# Patient Record
Sex: Female | Born: 1995 | Race: Black or African American | Hispanic: No | Marital: Single | Smoking: Former smoker
Health system: Southern US, Community
[De-identification: ages and names within clinical notes are randomized; demographics above are authoritative.]

## PROBLEM LIST (undated history)

## (undated) ENCOUNTER — Inpatient Hospital Stay (HOSPITAL_COMMUNITY): Payer: Self-pay

## (undated) DIAGNOSIS — G43909 Migraine, unspecified, not intractable, without status migrainosus: Secondary | ICD-10-CM

## (undated) DIAGNOSIS — A749 Chlamydial infection, unspecified: Secondary | ICD-10-CM

## (undated) DIAGNOSIS — D649 Anemia, unspecified: Secondary | ICD-10-CM

## (undated) HISTORY — PX: WISDOM TOOTH EXTRACTION: SHX21

---
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
## 2015-02-15 ENCOUNTER — Inpatient Hospital Stay (HOSPITAL_COMMUNITY)
Admission: AD | Admit: 2015-02-15 | Discharge: 2015-02-15 | Disposition: A | Discharge status: Home / Self Care | Payer: Medicaid Other | Source: Ambulatory Visit | Attending: Obstetrics and Gynecology | Admitting: Obstetrics and Gynecology

## 2015-02-15 ENCOUNTER — Inpatient Hospital Stay (HOSPITAL_COMMUNITY): Payer: Medicaid Other

## 2015-02-15 ENCOUNTER — Encounter (HOSPITAL_COMMUNITY): Payer: Self-pay | Admitting: *Deleted

## 2015-02-15 DIAGNOSIS — R1030 Lower abdominal pain, unspecified: Secondary | ICD-10-CM | POA: Insufficient documentation

## 2015-02-15 DIAGNOSIS — R102 Pelvic and perineal pain: Secondary | ICD-10-CM | POA: Diagnosis not present

## 2015-02-15 DIAGNOSIS — Z87891 Personal history of nicotine dependence: Secondary | ICD-10-CM | POA: Insufficient documentation

## 2015-02-15 LAB — URINALYSIS, ROUTINE W REFLEX MICROSCOPIC
Bilirubin Urine: NEGATIVE
Glucose, UA: NEGATIVE mg/dL
Ketones, ur: 15 mg/dL — AB
Nitrite: NEGATIVE
PROTEIN: NEGATIVE mg/dL
Specific Gravity, Urine: 1.025 (ref 1.005–1.030)
Urobilinogen, UA: 1 mg/dL (ref 0.0–1.0)
pH: 6 (ref 5.0–8.0)

## 2015-02-15 LAB — WET PREP, GENITAL
Trich, Wet Prep: NONE SEEN
Yeast Wet Prep HPF POC: NONE SEEN

## 2015-02-15 LAB — CBC
HCT: 32.6 % — ABNORMAL LOW (ref 36.0–46.0)
Hemoglobin: 11.1 g/dL — ABNORMAL LOW (ref 12.0–15.0)
MCH: 30.7 pg (ref 26.0–34.0)
MCHC: 34 g/dL (ref 30.0–36.0)
MCV: 90.1 fL (ref 78.0–100.0)
PLATELETS: 164 10*3/uL (ref 150–400)
RBC: 3.62 MIL/uL — ABNORMAL LOW (ref 3.87–5.11)
RDW: 13.5 % (ref 11.5–15.5)
WBC: 14.3 10*3/uL — AB (ref 4.0–10.5)

## 2015-02-15 LAB — URINE MICROSCOPIC-ADD ON

## 2015-02-15 LAB — POCT PREGNANCY, URINE: Preg Test, Ur: NEGATIVE

## 2015-02-15 MED ORDER — KETOROLAC TROMETHAMINE 60 MG/2ML IM SOLN
60.0000 mg | Freq: Once | INTRAMUSCULAR | Status: AC
  Administered 2015-02-15: 60 mg via INTRAMUSCULAR
  Filled 2015-02-15: qty 2

## 2015-02-15 MED ORDER — KETOROLAC TROMETHAMINE 10 MG PO TABS: 10.0000 mg | ORAL_TABLET | Freq: Four times a day (QID) | ORAL | Status: DC | PRN

## 2015-02-15 NOTE — Discharge Instructions (Signed)

## 2015-02-15 NOTE — MAU Note (Signed)
Lower abdominal pain since last night.  LMP 02/10/15.  Denies vaginal discharge. Light bleeding from the end of her period.  Some nausea, denies vomiting/diarrhea.  Reports cold sweat but has not checked temp.

## 2015-02-15 NOTE — MAU Note (Signed)
Pt states that drinking alcohol may have brought this on. Last had alcohol on Sat. and was intoxicated. The abdominal pain woke patient out of sleep on Sunday.

## 2015-02-15 NOTE — MAU Provider Note (Signed)
History     CSN: [id number]  Arrival date and time: 02/15/15 7829   First Provider Initiated Contact with Patient 02/15/15 2202      Chief Complaint  Patient presents with  . Abdominal Pain   Abdominal Pain This is a new problem. The current episode started yesterday. The onset quality is sudden. The problem occurs constantly. The problem has been gradually worsening. The pain is located in the suprapubic region. The pain is at a severity of 10/10. The quality of the pain is sharp and cramping. The abdominal pain radiates to the back. Associated symptoms include nausea. Pertinent negatives include no constipation, diarrhea, dysuria, fever, frequency or vomiting. The pain is aggravated by eating and movement. The pain is relieved by nothing. Treatments tried: NSAID  The treatment provided mild relief.     History reviewed. No pertinent past medical history.  History reviewed. No pertinent past surgical history.  History reviewed. No pertinent family history.  History  Substance Use Topics  . Smoking status: Former Smoker    Types: Cigars  . Smokeless tobacco: Not on file  . Alcohol Use: Yes    Allergies: No Known Allergies  Prescriptions prior to admission  Medication Sig Dispense Refill Last Dose  . ibuprofen (ADVIL,MOTRIN) 800 MG tablet Take 1 tablet (800 mg total) by mouth every 8 (eight) hours as needed for mild pain or moderate pain. 15 tablet 0 02/14/2015 at Unknown time  . cyclobenzaprine (FLEXERIL) 10 MG tablet Take 1 tablet (10 mg total) by mouth 3 (three) times daily as needed for muscle spasms. (Patient not taking: Reported on 02/15/2015) 10 tablet 0 more than one month  . HYDROcodone-acetaminophen (NORCO/VICODIN) 5-325 MG per tablet Take 1 tablet by mouth every 4 (four) hours as needed for moderate pain or severe pain. (Patient not taking: Reported on 02/15/2015) 15 tablet 0 more than one month  . valACYclovir (VALTREX) 1000 MG tablet Take 1 tablet (1,000 mg total) by  mouth 2 (two) times daily. (Patient not taking: Reported on 02/15/2015) 30 tablet 0     Review of Systems  Constitutional: Negative for fever.  Gastrointestinal: Positive for nausea and abdominal pain. Negative for vomiting, diarrhea and constipation.  Genitourinary: Negative for dysuria, urgency and frequency.   Physical Exam   Blood pressure 94/54, pulse 92, temperature 98.6 F (37 C), temperature source Oral, resp. rate 18, height  (1.6 m), weight 56.7 kg (125 lb), last menstrual period 02/10/2015.  Physical Exam  Nursing note and vitals reviewed. Constitutional: She is oriented to person, place, and time. She appears well-developed and well-nourished.  Cardiovascular: Normal rate.   Respiratory: Effort normal.  GI: Soft. There is no tenderness. There is no rebound.  Genitourinary:   External: no lesion Vagina: small amount of white discharge Cervix: pink, smooth, no CMT Uterus: NSSC Adnexa: R more tender than the L   Neurological: She is alert and oriented to person, place, and time.  Skin: Skin is warm and dry.  Psychiatric: She has a normal mood and affect.   Results for orders placed or performed during the hospital encounter of 02/15/15 (from the past 24 hour(s))  Urinalysis, Routine w reflex microscopic     Status: Abnormal   Collection Time: 02/15/15  8:06 PM  Result Value Ref Range   Color, Urine YELLOW YELLOW   APPearance CLOUDY (A) CLEAR   Specific Gravity, Urine 1.025 1.005 - 1.030   pH 6.0 5.0 - 8.0   Glucose, UA NEGATIVE NEGATIVE mg/dL  Hgb urine dipstick SMALL (A) NEGATIVE   Bilirubin Urine NEGATIVE NEGATIVE   Ketones, ur 15 (A) NEGATIVE mg/dL   Protein, ur NEGATIVE NEGATIVE mg/dL   Urobilinogen, UA 1.0 0.0 - 1.0 mg/dL   Nitrite NEGATIVE NEGATIVE   Leukocytes, UA LARGE (A) NEGATIVE  Urine microscopic-add on     Status: Abnormal   Collection Time: 02/15/15  8:06 PM  Result Value Ref Range   Squamous Epithelial / LPF FEW (A) RARE   WBC, UA TOO  NUMEROUS TO COUNT <3 WBC/hpf   RBC / HPF 0-2 <3 RBC/hpf   Bacteria, UA FEW (A) RARE   Urine-Other MUCOUS PRESENT   Pregnancy, urine POC     Status: None   Collection Time: 02/15/15  8:10 PM  Result Value Ref Range   Preg Test, Ur NEGATIVE NEGATIVE  CBC     Status: Abnormal   Collection Time: 02/15/15  9:52 PM  Result Value Ref Range   WBC 14.3 (H) 4.0 - 10.5 K/uL   RBC 3.62 (L) 3.87 - 5.11 MIL/uL   Hemoglobin 11.1 (L) 12.0 - 15.0 g/dL   HCT 16.1 (L) 09.6 - 04.5 %   MCV 90.1 78.0 - 100.0 fL   MCH 30.7 26.0 - 34.0 pg   MCHC 34.0 30.0 - 36.0 g/dL   RDW 40.9 81.1 - 91.4 %   Platelets 164 150 - 400 K/uL  Wet prep, genital     Status: Abnormal   Collection Time: 02/15/15 10:10 PM  Result Value Ref Range   Yeast Wet Prep HPF POC NONE SEEN NONE SEEN   Trich, Wet Prep NONE SEEN NONE SEEN   Clue Cells Wet Prep HPF POC FEW (A) NONE SEEN   WBC, Wet Prep HPF POC MODERATE (A) NONE SEEN   US Transvaginal Non-ob  02/15/2015   CLINICAL DATA:  Lower abdominal/pelvic pain since 02/14/2015. LMP 02/10/2015.  EXAM: TRANSABDOMINAL AND TRANSVAGINAL ULTRASOUND OF PELVIS  TECHNIQUE: Both transabdominal and transvaginal ultrasound examinations of the pelvis were performed. Transabdominal technique was performed for global imaging of the pelvis including uterus, ovaries, adnexal regions, and pelvic cul-de-sac. It was necessary to proceed with endovaginal exam following the transabdominal exam to visualize the endometrium and right ovary.  COMPARISON:  None  FINDINGS: Uterus  Measurements: 6.7 x 4.5 x 4.9 cm. No fibroids or other mass visualized.  Endometrium  Thickness: 12 mm.  No focal abnormality visualized.  Right ovary  Measurements: 3.4 x 2.2 x 2.5 cm. Normal appearance/no adnexal mass.  Left ovary  Measurements: 3.4 x 2.0 x 2.6 cm. Normal appearance/no adnexal mass.  Other findings  No free fluid.  IMPRESSION: 1. Normal appearance of the ovaries. 2. Endometrium with normal limits in thickness for a  premenopausal female although somewhat thicker than expected given LMP. No focal abnormality. 3. Otherwise unremarkable appearance of the uterus.   Electronically Signed   By: Sebastian Ache   On: 02/15/2015 23:29   US Pelvis Complete  02/15/2015   CLINICAL DATA:  Lower abdominal/pelvic pain since 02/14/2015. LMP 02/10/2015.  EXAM: TRANSABDOMINAL AND TRANSVAGINAL ULTRASOUND OF PELVIS  TECHNIQUE: Both transabdominal and transvaginal ultrasound examinations of the pelvis were performed. Transabdominal technique was performed for global imaging of the pelvis including uterus, ovaries, adnexal regions, and pelvic cul-de-sac. It was necessary to proceed with endovaginal exam following the transabdominal exam to visualize the endometrium and right ovary.  COMPARISON:  None  FINDINGS: Uterus  Measurements: 6.7 x 4.5 x 4.9 cm. No fibroids or other mass visualized.  Endometrium  Thickness: 12 mm.  No focal abnormality visualized.  Right ovary  Measurements: 3.4 x 2.2 x 2.5 cm. Normal appearance/no adnexal mass.  Left ovary  Measurements: 3.4 x 2.0 x 2.6 cm. Normal appearance/no adnexal mass.  Other findings  No free fluid.  IMPRESSION: 1. Normal appearance of the ovaries. 2. Endometrium with normal limits in thickness for a premenopausal female although somewhat thicker than expected given LMP. No focal abnormality. 3. Otherwise unremarkable appearance of the uterus.   Electronically Signed   By: Sebastian AcheAllen  Grady   On: 02/15/2015 23:29    MAU Course  Procedures  MDM Patient had toradol, and pain has improved   Assessment and Plan   1. Lower abdominal pain    DC home RX toradol PRN No GYN cause of pain If pain does not improve or fever/nausea/vomiting develop please go to the ED  Follow-up Information    Follow up with [facility]-EMERGENCY [facility]PT.   Specialty:  Emergency Medicine   Why:  If symptoms worsen   Contact information:   [id number] [address] [place]  [place] [id number] [phone]       Tawnya CrookHogan, Heather Donovan 02/15/2015, 10:03 PM

## 2015-02-16 ENCOUNTER — Ambulatory Visit (INDEPENDENT_AMBULATORY_CARE_PROVIDER_SITE_OTHER): Payer: Medicaid Other

## 2015-02-16 VITALS — BP 110/71 | HR 74 | Wt 124.8 lb

## 2015-02-16 DIAGNOSIS — A549 Gonococcal infection, unspecified: Secondary | ICD-10-CM | POA: Diagnosis not present

## 2015-02-16 LAB — GC/CHLAMYDIA PROBE AMP (~~LOC~~) NOT AT ARMC
Chlamydia: NEGATIVE
Neisseria Gonorrhea: POSITIVE — AB

## 2015-02-16 LAB — HIV ANTIBODY (ROUTINE TESTING W REFLEX): HIV Screen 4th Generation wRfx: NONREACTIVE

## 2015-02-16 MED ORDER — CEFTRIAXONE SODIUM 1 G IJ SOLR
250.0000 mg | Freq: Once | INTRAMUSCULAR | Status: AC
  Administered 2015-02-16: 250 mg via INTRAMUSCULAR

## 2015-02-16 MED ORDER — AZITHROMYCIN 250 MG PO TABS
1000.0000 mg | ORAL_TABLET | Freq: Once | ORAL | Status: AC
  Administered 2015-02-16: 1000 mg via ORAL

## 2015-02-19 LAB — URINE CULTURE: Colony Count: >=100000

## 2015-02-20 ENCOUNTER — Telehealth: Payer: Self-pay | Admitting: Obstetrics and Gynecology

## 2015-02-20 MED ORDER — SULFAMETHOXAZOLE-TRIMETHOPRIM 800-160 MG PO TABS: 1.0000 | ORAL_TABLET | Freq: Two times a day (BID) | ORAL | Status: DC

## 2015-02-20 NOTE — Telephone Encounter (Signed)
Notified patient of +urine culture and +GC. Patient has already taken treatment for GC. Bactrim sent to patients pharmacy.

## 2015-05-19 ENCOUNTER — Encounter (HOSPITAL_COMMUNITY): Payer: Self-pay | Admitting: Emergency Medicine

## 2015-05-19 ENCOUNTER — Emergency Department (HOSPITAL_COMMUNITY)
Admission: EM | Admit: 2015-05-19 | Discharge: 2015-05-19 | Disposition: A | Discharge status: Home / Self Care | Payer: Medicaid Other | Attending: Emergency Medicine | Admitting: Emergency Medicine

## 2015-05-19 DIAGNOSIS — N898 Other specified noninflammatory disorders of vagina: Secondary | ICD-10-CM | POA: Insufficient documentation

## 2015-05-19 DIAGNOSIS — Z3202 Encounter for pregnancy test, result negative: Secondary | ICD-10-CM | POA: Diagnosis not present

## 2015-05-19 DIAGNOSIS — Z87891 Personal history of nicotine dependence: Secondary | ICD-10-CM | POA: Insufficient documentation

## 2015-05-19 DIAGNOSIS — N730 Acute parametritis and pelvic cellulitis: Secondary | ICD-10-CM | POA: Insufficient documentation

## 2015-05-19 DIAGNOSIS — R1030 Lower abdominal pain, unspecified: Secondary | ICD-10-CM | POA: Diagnosis present

## 2015-05-19 LAB — URINALYSIS, ROUTINE W REFLEX MICROSCOPIC
Bilirubin Urine: NEGATIVE
Glucose, UA: NEGATIVE mg/dL
Hgb urine dipstick: NEGATIVE
KETONES UR: 40 mg/dL — AB
Leukocytes, UA: NEGATIVE
NITRITE: NEGATIVE
PROTEIN: NEGATIVE mg/dL
SPECIFIC GRAVITY, URINE: 1.026 (ref 1.005–1.030)
Urobilinogen, UA: 0.2 mg/dL (ref 0.0–1.0)
pH: 5.5 (ref 5.0–8.0)

## 2015-05-19 LAB — COMPREHENSIVE METABOLIC PANEL
ALT: 11 U/L — ABNORMAL LOW (ref 14–54)
ANION GAP: 11 (ref 5–15)
AST: 17 U/L (ref 15–41)
Albumin: 4.2 g/dL (ref 3.5–5.0)
Alkaline Phosphatase: 49 U/L (ref 38–126)
BILIRUBIN TOTAL: 0.8 mg/dL (ref 0.3–1.2)
BUN: 8 mg/dL (ref 6–20)
CHLORIDE: 103 mmol/L (ref 101–111)
CO2: 24 mmol/L (ref 22–32)
Calcium: 9.6 mg/dL (ref 8.9–10.3)
Creatinine, Ser: 0.75 mg/dL (ref 0.44–1.00)
Glucose, Bld: 93 mg/dL (ref 65–99)
POTASSIUM: 3.6 mmol/L (ref 3.5–5.1)
SODIUM: 138 mmol/L (ref 135–145)
TOTAL PROTEIN: 7.8 g/dL (ref 6.5–8.1)

## 2015-05-19 LAB — WET PREP, GENITAL
Trich, Wet Prep: NONE SEEN
YEAST WET PREP: NONE SEEN

## 2015-05-19 LAB — CBC
HEMATOCRIT: 35.8 % — AB (ref 36.0–46.0)
HEMOGLOBIN: 12.1 g/dL (ref 12.0–15.0)
MCH: 31.2 pg (ref 26.0–34.0)
MCHC: 33.8 g/dL (ref 30.0–36.0)
MCV: 92.3 fL (ref 78.0–100.0)
Platelets: 153 10*3/uL (ref 150–400)
RBC: 3.88 MIL/uL (ref 3.87–5.11)
RDW: 13.7 % (ref 11.5–15.5)
WBC: 8 10*3/uL (ref 4.0–10.5)

## 2015-05-19 LAB — GC/CHLAMYDIA PROBE AMP (~~LOC~~) NOT AT ARMC
Chlamydia: NEGATIVE
NEISSERIA GONORRHEA: POSITIVE — AB

## 2015-05-19 LAB — I-STAT BETA HCG BLOOD, ED (MC, WL, AP ONLY)

## 2015-05-19 LAB — LIPASE, BLOOD: LIPASE: 25 U/L (ref 22–51)

## 2015-05-19 LAB — RPR: RPR: NONREACTIVE

## 2015-05-19 LAB — HIV ANTIBODY (ROUTINE TESTING W REFLEX): HIV Screen 4th Generation wRfx: NONREACTIVE

## 2015-05-19 MED ORDER — CEFTRIAXONE SODIUM 250 MG IJ SOLR
250.0000 mg | Freq: Once | INTRAMUSCULAR | Status: AC
  Administered 2015-05-19: 250 mg via INTRAMUSCULAR
  Filled 2015-05-19: qty 250

## 2015-05-19 MED ORDER — METRONIDAZOLE 500 MG PO TABS: 500.0000 mg | ORAL_TABLET | Freq: Two times a day (BID) | ORAL | Status: DC

## 2015-05-19 MED ORDER — IBUPROFEN 800 MG PO TABS
800.0000 mg | ORAL_TABLET | Freq: Once | ORAL | Status: AC
  Administered 2015-05-19: 800 mg via ORAL
  Filled 2015-05-19: qty 1

## 2015-05-19 MED ORDER — IBUPROFEN 800 MG PO TABS
800.0000 mg | ORAL_TABLET | Freq: Four times a day (QID) | ORAL | Status: DC | PRN
Start: 2015-05-19 — End: 2016-03-01

## 2015-05-19 MED ORDER — DOXYCYCLINE HYCLATE 100 MG PO TABS: 100.0000 mg | ORAL_TABLET | Freq: Two times a day (BID) | ORAL | Status: DC

## 2015-05-19 MED ORDER — TRAMADOL HCL 50 MG PO TABS
50.0000 mg | ORAL_TABLET | ORAL | Status: AC
Start: 2015-05-19 — End: 2015-05-19
  Administered 2015-05-19: 50 mg via ORAL
  Filled 2015-05-19: qty 1

## 2015-05-19 MED ORDER — METRONIDAZOLE 500 MG PO TABS
500.0000 mg | ORAL_TABLET | Freq: Once | ORAL | Status: AC
  Administered 2015-05-19: 500 mg via ORAL
  Filled 2015-05-19: qty 1

## 2015-05-19 MED ORDER — TRAMADOL HCL 50 MG PO TABS: 50.0000 mg | ORAL_TABLET | Freq: Four times a day (QID) | ORAL | Status: DC | PRN

## 2015-05-19 MED ORDER — DOXYCYCLINE HYCLATE 100 MG PO TABS
100.0000 mg | ORAL_TABLET | Freq: Once | ORAL | Status: AC
  Administered 2015-05-19: 100 mg via ORAL
  Filled 2015-05-19: qty 1

## 2015-05-19 NOTE — Discharge Instructions (Signed)
Pelvic Inflammatory Disease °Pelvic inflammatory disease (PID) refers to an infection in some or all of the female organs. The infection can be in the uterus, ovaries, fallopian tubes, or the surrounding tissues in the pelvis. PID can cause abdominal or pelvic pain that comes on suddenly (acute pelvic pain). PID is a serious infection because it can lead to lasting (chronic) pelvic pain or the inability to have children (infertile).  °CAUSES  °The infection is often caused by the normal bacteria found in the vaginal tissues. PID may also be caused by an infection that is spread during sexual contact. PID can also occur following:  °· The birth of a baby.   °· A miscarriage.   °· An abortion.   °· Major pelvic surgery.   °· The use of an intrauterine device (IUD).   °· A sexual assault.   °RISK FACTORS °Certain factors can put a person at higher risk for PID, such as: °· Being younger than 25 years. °· Being sexually active at a young age. °· Using nonbarrier contraception. °· Having multiple sexual partners. °· Having sex with someone who has symptoms of a genital infection. °· Using oral contraception. °Other times, certain behaviors can increase the possibility of getting PID, such as: °· Having sex during your period. °· Using a vaginal douche. °· Having an intrauterine device (IUD) in place. °SYMPTOMS  °· Abdominal or pelvic pain.   °· Fever.   °· Chills.   °· Abnormal vaginal discharge. °· Abnormal uterine bleeding.   °· Unusual pain shortly after finishing your period. °DIAGNOSIS  °Your caregiver will choose some of the following methods to make a diagnosis, such as:  °· Performing a physical exam and history. A pelvic exam typically reveals a very tender uterus and surrounding pelvis.   °· Ordering laboratory tests including a pregnancy test, blood tests, and urine test.  °· Ordering cultures of the vagina and cervix to check for a sexually transmitted infection (STI). °· Performing an ultrasound.    °· Performing a laparoscopic procedure to look inside the pelvis.   °TREATMENT  °· Antibiotic medicines may be prescribed and taken by mouth.   °· Sexual partners may be treated when the infection is caused by a sexually transmitted disease (STD).   °· Hospitalization may be needed to give antibiotics intravenously. °· Surgery may be needed, but this is rare. °It may take weeks until you are completely well. If you are diagnosed with PID, you should also be checked for human immunodeficiency virus (HIV).   °HOME CARE INSTRUCTIONS  °· If given, take your antibiotics as directed. Finish the medicine even if you start to feel better.   °· Only take over-the-counter or prescription medicines for pain, discomfort, or fever as directed by your caregiver.   °· Do not have sexual intercourse until treatment is completed or as directed by your caregiver. If PID is confirmed, your recent sexual partner(s) will need treatment.   °· Keep your follow-up appointments. °SEEK MEDICAL CARE IF:  °· You have increased or abnormal vaginal discharge.   °· You need prescription medicine for your pain.   °· You vomit.   °· You cannot take your medicines.   °· Your partner has an STD.   °SEEK IMMEDIATE MEDICAL CARE IF:  °· You have a fever.   °· You have increased abdominal or pelvic pain.   °· You have chills.   °· You have pain when you urinate.   °· You are not better after 72 hours following treatment.   °MAKE SURE YOU:  °· Understand these instructions. °· Will watch your condition. °· Will get help right away if you are not doing well or get worse. °  Document Released: 10/16/2005 Document Revised: 02/10/2013 Document Reviewed: 10/12/2011 °ExitCare® Patient Information ©2015 [employer]. This information is not intended to replace advice given to you by your health care provider. Make sure you discuss any questions you have with your health care provider. ° °Sexually Transmitted Disease °A sexually transmitted disease (STD) is a  disease or infection that may be passed (transmitted) from person to person, usually during sexual activity. This may happen by way of saliva, semen, blood, vaginal mucus, or urine. Common STDs include:  °· Gonorrhea.   °· Chlamydia.   °· Syphilis.   °· HIV and AIDS.   °· Genital herpes.   °· Hepatitis B and C.   °· Trichomonas.   °· Human papillomavirus (HPV).   °· Pubic lice.   °· Scabies. °· Mites. °· Bacterial vaginosis. °WHAT ARE CAUSES OF STDs? °An STD may be caused by bacteria, a virus, or parasites. STDs are often transmitted during sexual activity if one person is infected. However, they may also be transmitted through nonsexual means. STDs may be transmitted after:  °· Sexual intercourse with an infected person.   °· Sharing sex toys with an infected person.   °· Sharing needles with an infected person or using unclean piercing or tattoo needles. °· Having intimate contact with the genitals, mouth, or rectal areas of an infected person.   °· Exposure to infected fluids during birth. °WHAT ARE THE SIGNS AND SYMPTOMS OF STDs? °Different STDs have different symptoms. Some people may not have any symptoms. If symptoms are present, they may include:  °· Painful or bloody urination.   °· Pain in the pelvis, abdomen, vagina, anus, throat, or eyes.   °· A skin rash, itching, or irritation. °· Growths, ulcerations, blisters, or sores in the genital and anal areas. °· Abnormal vaginal discharge with or without bad odor.   °· Penile discharge in men.   °· Fever.   °· Pain or bleeding during sexual intercourse.   °· Swollen glands in the groin area.   °· Yellow skin and eyes (jaundice). This is seen with hepatitis.   °· Swollen testicles. °· Infertility. °· Sores and blisters in the mouth. °HOW ARE STDs DIAGNOSED? °To make a diagnosis, your health care provider may:  °· Take a medical history.   °· Perform a physical exam.   °· Take a sample of any discharge to examine. °· Swab the throat, cervix, opening to the  penis, rectum, or vagina for testing. °· Test a sample of your first morning urine.   °· Perform blood tests.   °· Perform a Pap test, if this applies.   °· Perform a colposcopy.   °· Perform a laparoscopy.   °HOW ARE STDs TREATED? ° Treatment depends on the STD. Some STDs may be treated but not cured.  °· Chlamydia, gonorrhea, trichomonas, and syphilis can be cured with antibiotic medicine.   °· Genital herpes, hepatitis, and HIV can be treated, but not cured, with prescribed medicines. The medicines lessen symptoms.   °· Genital warts from HPV can be treated with medicine or by freezing, burning (electrocautery), or surgery. Warts may come back.   °· HPV cannot be cured with medicine or surgery. However, abnormal areas may be removed from the cervix, vagina, or vulva.   °· If your diagnosis is confirmed, your recent sexual partners need treatment. This is true even if they are symptom-free or have a negative culture or evaluation. They should not have sex until their health care providers say it is okay. °HOW CAN I REDUCE MY RISK OF GETTING AN STD? °Take these steps to reduce your risk of getting an STD: °· Use latex condoms, dental dams,   and water-soluble lubricants during sexual activity. Do not use petroleum jelly or oils.  Avoid having multiple sex partners.  Do not have sex with someone who has other sex partners.  Do not have sex with anyone you do not know or who is at high risk for an STD.  Avoid risky sex practices that can break your skin.  Do not have sex if you have open sores on your mouth or skin.  Avoid drinking too much alcohol or taking illegal drugs. Alcohol and drugs can affect your judgment and put you in a vulnerable position.  Avoid engaging in oral and anal sex acts.  Get vaccinated for HPV and hepatitis. If you have not received these vaccines in the past, talk to your health care provider about whether one or both might be right for you.   If you are at risk of being  infected with HIV, it is recommended that you take a prescription medicine daily to prevent HIV infection. This is called pre-exposure prophylaxis (PrEP). You are considered at risk if:  You are a man who has sex with other men (MSM).  You are a heterosexual man or woman and are sexually active with more than one partner.  You take drugs by injection.  You are sexually active with a partner who has HIV.  Talk with your health care provider about whether you are at high risk of being infected with HIV. If you choose to begin PrEP, you should first be tested for HIV. You should then be tested every 3 months for as long as you are taking PrEP.  WHAT SHOULD I DO IF I THINK I HAVE AN STD?  See your health care provider.   Tell your sexual partner(s). They should be tested and treated for any STDs.  Do not have sex until your health care provider says it is okay. WHEN SHOULD I GET IMMEDIATE MEDICAL CARE? Contact your health care provider right away if:   You have severe abdominal pain.  You are a man and notice swelling or pain in your testicles.  You are a woman and notice swelling or pain in your vagina. Document Released: 01/06/2003 Document Revised: 10/21/2013 Document Reviewed: 05/06/2013 [facility] Patient Information 2015 [place]. This information is not intended to replace advice given to you by your health care provider. Make sure you discuss any questions you have with your health care provider.

## 2015-05-19 NOTE — ED Notes (Signed)
Pt. reports low abdominal pain with vaginal discharge onset today , denies emesis or diarrhea. No fever or chills.

## 2015-05-19 NOTE — ED Provider Notes (Signed)
CSN: [id number]     Arrival date & time 05/19/15  0003 History   This chart was scribed for Marisa Severinlga Benz Vandenberghe, MD by Abel PrestoKara Demonbreun, ED Scribe. This patient was seen in room A13C/A13C and the patient's care was started at 3:04 AM.    Chief Complaint  Patient presents with  . Abdominal Pain     The history is provided by the patient. No language interpreter was used.   HPI Comments: Milinda CaveLadanna J Bello is a 19 y.o. female who presents to the Emergency Department complaining of suprapubic abdominal pain with onset yesterday morning around 11 AM. She states pain radiates to back. She notes associated nausea and brownish vaginal discharge. She reports h/o PID and states this feels similar. She is sexually active but denies new partners. Pt's LNMP ended 4 days ago. Pt denies fever, chills, vomiting, and diarrhea.  Patient reports that she does not use condoms, but has been with her current partner for several years.  History reviewed. No pertinent past medical history. History reviewed. No pertinent past surgical history. No family history on file. History  Substance Use Topics  . Smoking status: Former Smoker    Types: Cigars  . Smokeless tobacco: Not on file  . Alcohol Use: Yes   OB History    No data available     Review of Systems  Constitutional: Negative for fever and chills.  Gastrointestinal: Positive for nausea. Negative for vomiting and diarrhea.  Genitourinary: Positive for vaginal discharge and pelvic pain.      Allergies  Review of patient's allergies indicates no known allergies.  Home Medications   Prior to Admission medications   Medication Sig Start Date End Date Taking? Authorizing Provider  ketorolac (TORADOL) 10 MG tablet Take 1 tablet (10 mg total) by mouth every 6 (six) hours as needed. Patient not taking: Reported on 05/19/2015 02/15/15   Armando ReichertHeather D Hogan, CNM  sulfamethoxazole-trimethoprim (BACTRIM DS,SEPTRA DS) 800-160 MG per tablet Take 1 tablet by mouth 2 (two)  times daily. Patient not taking: Reported on 05/19/2015 02/20/15   Duane LopeJennifer I Rasch, NP   BP 115/67 mmHg  Pulse 76  Temp(Src) 98.6 F (37 C) (Oral)  Resp 16  Ht 5\' 3"  (1.6 m)  Wt 125 lb (56.7 kg)  BMI 22.15 kg/m2  SpO2 100%  LMP 05/12/2015 Physical Exam  Constitutional: She is oriented to person, place, and time. She appears well-developed and well-nourished.  HENT:  Head: Normocephalic and atraumatic.  Nose: Nose normal.  Mouth/Throat: Oropharynx is clear and moist.  Eyes: Conjunctivae and EOM are normal. Pupils are equal, round, and reactive to light.  Neck: Normal range of motion. Neck supple. No JVD present. No tracheal deviation present. No thyromegaly present.  Cardiovascular: Normal rate, regular rhythm, normal heart sounds and intact distal pulses.  Exam reveals no gallop and no friction rub.   No murmur heard. Pulmonary/Chest: Effort normal and breath sounds normal. No stridor. No respiratory distress. She has no wheezes. She has no rales. She exhibits no tenderness.  Abdominal: Soft. Bowel sounds are normal. She exhibits no distension and no mass. There is tenderness (tenderness across lower abdomen without rebound or guarding). There is no rebound and no guarding.  Genitourinary:  External genitalia normal Vagina with copious amount of purulent discharge Cervix  normal, aside from large amounts of pus extruding through os positive for cervical motion tenderness Adnexa palpated, no masses or positive for tenderness noted Bladder palpated positive for tenderness Uterus palpated no masses or positive for  tenderness    Musculoskeletal: Normal range of motion. She exhibits no edema or tenderness.  Lymphadenopathy:    She has no cervical adenopathy.  Neurological: She is alert and oriented to person, place, and time. She displays normal reflexes. She exhibits normal muscle tone. Coordination normal.  Skin: Skin is warm and dry. No rash noted. No erythema. No pallor.   Psychiatric: She has a normal mood and affect. Her behavior is normal. Judgment and thought content normal.  Nursing note and vitals reviewed.   ED Course  Procedures (including critical care time) DIAGNOSTIC STUDIES: Oxygen Saturation is 100% on room air, normal by my interpretation.    COORDINATION OF CARE: 3:07 AM Discussed treatment plan with patient at beside, the patient agrees with the plan and has no further questions at this time.   Labs Review Labs Reviewed  COMPREHENSIVE METABOLIC PANEL - Abnormal; Notable for the following:    ALT 11 (*)    All other components within normal limits  CBC - Abnormal; Notable for the following:    HCT 35.8 (*)    All other components within normal limits  URINALYSIS, ROUTINE W REFLEX MICROSCOPIC (NOT AT [facility]) - Abnormal; Notable for the following:    Color, Urine AMBER (*)    APPearance CLOUDY (*)    Ketones, ur 40 (*)    All other components within normal limits  WET PREP, GENITAL  LIPASE, BLOOD  HIV ANTIBODY (ROUTINE TESTING)  RPR  I-STAT BETA HCG BLOOD, ED ([facility], [facility], [facility] ONLY)  GC/CHLAMYDIA PROBE AMP ([place]) NOT AT [facility]    Imaging Review No results found.   EKG Interpretation None      MDM   Final diagnoses:  PID (acute pelvic inflammatory disease)    I personally performed the services described in this documentation, which was scribed in my presence. The recorded information has been reviewed and is accurate.  19 year old female with onset of vaginal discharge with pelvic pain today.  She reports symptoms are similar to prior PID.  Pelvic exam completed.  Copious amounts of malodorous discharge noted.  Patient has cervical motion tenderness.  No focal tenderness on either adnexa, diffuse tenderness across the pelvis.  Patient treated for PID.  She is not febrile or ill appearing.  She is instructed to follow-up with the GYN clinic and/or [facility] should she have fevers or worsening pain.  Asian instructed  not to have intercourse with her partner until he has been checked as well.   Marisa Severin, MD 05/19/15 [id number]

## 2015-05-20 ENCOUNTER — Telehealth: Payer: Self-pay | Admitting: Emergency Medicine

## 2015-05-20 NOTE — Telephone Encounter (Signed)
Positive Gonorrhea culture Treated with Rocephin and Doxycycline per protocol MD [facility] faxed  Patient contact 05/20/15 @ 1454  ID verified, patient notified of positive Gonorrhea and that treatment was given while in ED. Patient confirmed having filled presciption and taking as prescribed. STD instructions provided, patient verbalized understanding.

## 2015-08-30 ENCOUNTER — Emergency Department (INDEPENDENT_AMBULATORY_CARE_PROVIDER_SITE_OTHER)
Admission: EM | Admit: 2015-08-30 | Discharge: 2015-08-30 | Disposition: A | Discharge status: Home / Self Care | Payer: Medicaid Other | Source: Home / Self Care | Attending: Family Medicine | Admitting: Family Medicine

## 2015-08-30 ENCOUNTER — Encounter (HOSPITAL_COMMUNITY): Payer: Self-pay | Admitting: Emergency Medicine

## 2015-08-30 DIAGNOSIS — F10920 Alcohol use, unspecified with intoxication, uncomplicated: Secondary | ICD-10-CM

## 2015-08-30 DIAGNOSIS — J029 Acute pharyngitis, unspecified: Secondary | ICD-10-CM | POA: Diagnosis not present

## 2015-08-30 LAB — POCT PREGNANCY, URINE: Preg Test, Ur: NEGATIVE
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
## 2015-08-30 NOTE — Discharge Instructions (Signed)
Sore Throat Cepacol lozenges for sore throat. Tylenol or ibuprofen when necessary Zyrtec or Claritin for drainage in your throat. A sore throat is pain, burning, irritation, or scratchiness of the throat. There is often pain or tenderness when swallowing or talking. A sore throat may be accompanied by other symptoms, such as coughing, sneezing, fever, and swollen neck glands. A sore throat is often the first sign of another sickness, such as a cold, flu, strep throat, or mononucleosis (commonly known as mono). Most sore throats go away without medical treatment. CAUSES  The most common causes of a sore throat include:  A viral infection, such as a cold, flu, or mono.  A bacterial infection, such as strep throat, tonsillitis, or whooping cough.  Seasonal allergies.  Dryness in the air.  Irritants, such as smoke or pollution.  Gastroesophageal reflux disease (GERD). HOME CARE INSTRUCTIONS   Only take over-the-counter medicines as directed by your caregiver.  Drink enough fluids to keep your urine clear or pale yellow.  Rest as needed.  Try using throat sprays, lozenges, or sucking on hard candy to ease any pain (if older than 4 years or as directed).  Sip warm liquids, such as broth, herbal tea, or warm water with honey to relieve pain temporarily. You may also eat or drink cold or frozen liquids such as frozen ice pops.  Gargle with salt water (mix 1 tsp salt with 8 oz of water).  Do not smoke and avoid secondhand smoke.  Put a cool-mist humidifier in your bedroom at night to moisten the air. You can also turn on a hot shower and sit in the bathroom with the door closed for 5-10 minutes. SEEK IMMEDIATE MEDICAL CARE IF:  You have difficulty breathing.  You are unable to swallow fluids, soft foods, or your saliva.  You have increased swelling in the throat.  Your sore throat does not get better in 7 days.  You have nausea and vomiting.  You have a fever or persistent  symptoms for more than 2-3 days.  You have a fever and your symptoms suddenly get worse. MAKE SURE YOU:   Understand these instructions.  Will watch your condition.  Will get help right away if you are not doing well or get worse.   This information is not intended to replace advice given to you by your health care provider. Make sure you discuss any questions you have with your health care provider.   Document Released: 11/23/2004 Document Revised: 11/06/2014 Document Reviewed: 06/23/2012 Elsevier Interactive Patient Education [employer]2016 Elsevier Inc.

## 2015-11-05 ENCOUNTER — Emergency Department (INDEPENDENT_AMBULATORY_CARE_PROVIDER_SITE_OTHER)
Admission: EM | Admit: 2015-11-05 | Discharge: 2015-11-05 | Disposition: A | Discharge status: Home / Self Care | Payer: Medicaid Other | Source: Home / Self Care | Attending: Family Medicine | Admitting: Family Medicine

## 2015-11-05 ENCOUNTER — Encounter (HOSPITAL_COMMUNITY): Payer: Self-pay | Admitting: Emergency Medicine

## 2015-11-05 DIAGNOSIS — G43009 Migraine without aura, not intractable, without status migrainosus: Secondary | ICD-10-CM

## 2015-11-05 MED ORDER — KETOROLAC TROMETHAMINE 60 MG/2ML IM SOLN
INTRAMUSCULAR | Status: AC
  Filled 2015-11-05: qty 2

## 2015-11-05 MED ORDER — ONDANSETRON HCL 4 MG PO TABS: 4.0000 mg | ORAL_TABLET | Freq: Four times a day (QID) | ORAL | Status: DC

## 2015-11-05 MED ORDER — KETOROLAC TROMETHAMINE 10 MG PO TABS: 10.0000 mg | ORAL_TABLET | Freq: Four times a day (QID) | ORAL | Status: DC | PRN

## 2015-11-05 MED ORDER — ONDANSETRON 4 MG PO TBDP
4.0000 mg | ORAL_TABLET | Freq: Once | ORAL | Status: AC
  Administered 2015-11-05: 4 mg via ORAL

## 2015-11-05 MED ORDER — KETOROLAC TROMETHAMINE 60 MG/2ML IM SOLN
60.0000 mg | Freq: Once | INTRAMUSCULAR | Status: AC
  Administered 2015-11-05: 60 mg via INTRAMUSCULAR

## 2015-11-05 MED ORDER — ONDANSETRON 4 MG PO TBDP
ORAL_TABLET | ORAL | Status: AC
  Filled 2015-11-05: qty 1

## 2015-11-05 NOTE — ED Notes (Signed)
C/o HA onset this am associated w/nauseas and BV... Pain increases w/bright light and loud noises A&O x4... No acute distress.

## 2015-11-05 NOTE — ED Provider Notes (Addendum)
CSN: [id number]     Arrival date & time 11/05/15  1553 History   First MD Initiated Contact with Patient 11/05/15 1738     No chief complaint on file.  (Consider location/radiation/quality/duration/timing/severity/associated sxs/prior Treatment) HPI History obtained from patient:   LOCATION:head SEVERITY: DURATION:12 pm CONTEXT: sudden onset today,  QUALITY:very similar to other migraines MODIFYING FACTORS:OTC meds without relief ASSOCIATED SYMPTOMS: nausea, vision TIMING:constant OCCUPATION: call center  No past medical history on file. No past surgical history on file. No family history on file. Social History  Substance Use Topics  . Smoking status: Former Smoker    Types: Cigars  . Smokeless tobacco: Not on file  . Alcohol Use: Yes   OB History    No data available     Review of Systems ROS +'ve headache  Denies:   NAUSEA, ABDOMINAL PAIN, CHEST PAIN, CONGESTION, DYSURIA, SHORTNESS OF BREATH  Allergies  Review of patient's allergies indicates no known allergies.  Home Medications   Prior to Admission medications   Medication Sig Start Date End Date Taking? Authorizing Provider  doxycycline (VIBRA-TABS) 100 MG tablet Take 1 tablet (100 mg total) by mouth 2 (two) times daily. Patient not taking: Reported on 08/30/2015 05/19/15   Marisa Severin, MD  ibuprofen (ADVIL,MOTRIN) 800 MG tablet Take 1 tablet (800 mg total) by mouth every 6 (six) hours as needed. 05/19/15   Marisa Severin, MD  ketorolac (TORADOL) 10 MG tablet Take 1 tablet (10 mg total) by mouth every 6 (six) hours as needed. Patient not taking: Reported on 05/19/2015 02/15/15   Armando Reichert, CNM  metroNIDAZOLE (FLAGYL) 500 MG tablet Take 1 tablet (500 mg total) by mouth 2 (two) times daily. 05/19/15   Marisa Severin, MD  sulfamethoxazole-trimethoprim (BACTRIM DS,SEPTRA DS) 800-160 MG per tablet Take 1 tablet by mouth 2 (two) times daily. Patient not taking: Reported on 05/19/2015 02/20/15   Duane Lope, NP  traMADol  (ULTRAM) 50 MG tablet Take 1 tablet (50 mg total) by mouth every 6 (six) hours as needed for moderate pain or severe pain. 05/19/15   Marisa Severin, MD   Meds Ordered and Administered this Visit   Medications  ketorolac (TORADOL) injection 60 mg (60 mg Intramuscular Given 11/05/15 1821)  ondansetron (ZOFRAN-ODT) disintegrating tablet 4 mg (4 mg Oral Given 11/05/15 1821)    There were no vitals taken for this visit. No data found.   Physical Exam  Constitutional: She is oriented to person, place, and time. She appears well-developed and well-nourished.  HENT:  Head: Normocephalic and atraumatic.  Right Ear: External ear normal.  Left Ear: External ear normal.  Nose: Nose normal.  Mouth/Throat: Oropharynx is clear and moist.  Eyes: Conjunctivae are normal.  Bilateral venous pulsations noted both eyes  Neck: Normal range of motion. Neck supple.  Pulmonary/Chest: Effort normal and breath sounds normal.  Abdominal: Soft.  Musculoskeletal: Normal range of motion.  Neurological: She is alert and oriented to person, place, and time.  Skin: Skin is warm and dry.  Psychiatric: She has a normal mood and affect. Her behavior is normal. Judgment and thought content normal.    ED Course  Procedures (including critical care time)  Labs Review Labs Reviewed - No data to display  Imaging Review No results found.   Visual Acuity Review  Right Eye Distance:   Left Eye Distance:   Bilateral Distance:    Right Eye Near:   Left Eye Near:    Bilateral Near:  MDM   1. Migraine without aura and without status migrainosus, not intractable     Patient is advised to continue home symptomatic treatment. Prescription for zofran/toradol sent pharmacy patient has indicated. Patient is advised that if there are new or worsening symptoms or attend the emergency department, or contact primary care provider. Instructions of care provided discharged home in stable condition. Return to work  note provided for patient  THIS NOTE WAS GENERATED USING A VOICE RECOGNITION SOFTWARE PROGRAM. ALL REASONABLE EFFORTS  WERE MADE TO PROOFREAD THIS DOCUMENT FOR ACCURACY.     Tharon AquasFrank C Patrick, PA 11/05/15 1813  Tharon AquasFrank C Patrick, PA 12/30/15 1355

## 2015-11-05 NOTE — Discharge Instructions (Signed)
Migraine Headache  A migraine headache is very bad, throbbing pain on one or both sides of your head. Talk to your doctor about what things may bring on (trigger) your migraine headaches.  HOME CARE  · Only take medicines as told by your doctor.  · Lie down in a dark, quiet room when you have a migraine.  · Keep a journal to find out if certain things bring on migraine headaches. For example, write down:    What you eat and drink.    How much sleep you get.    Any change to your diet or medicines.  · Lessen how much alcohol you drink.  · Quit smoking if you smoke.  · Get enough sleep.  · Lessen any stress in your life.  · Keep lights dim if bright lights bother you or make your migraines worse.  GET HELP RIGHT AWAY IF:   · Your migraine becomes really bad.  · You have a fever.  · You have a stiff neck.  · You have trouble seeing.  · Your muscles are weak, or you lose muscle control.  · You lose your balance or have trouble walking.  · You feel like you will pass out (faint), or you pass out.  · You have really bad symptoms that are different than your first symptoms.  MAKE SURE YOU:   · Understand these instructions.  · Will watch your condition.  · Will get help right away if you are not doing well or get worse.     This information is not intended to replace advice given to you by your health care provider. Make sure you discuss any questions you have with your health care provider.     Document Released: 07/25/2008 Document Revised: 01/08/2012 Document Reviewed: 06/23/2013  Elsevier Interactive Patient Education ©2016 Elsevier Inc.

## 2015-11-11 ENCOUNTER — Telehealth (HOSPITAL_COMMUNITY): Payer: Self-pay | Admitting: Emergency Medicine

## 2015-11-20 ENCOUNTER — Encounter (HOSPITAL_COMMUNITY): Payer: Self-pay | Admitting: *Deleted

## 2015-11-20 ENCOUNTER — Emergency Department (HOSPITAL_COMMUNITY)
Admission: EM | Admit: 2015-11-20 | Discharge: 2015-11-20 | Disposition: A | Discharge status: Home / Self Care | Payer: Medicaid Other | Attending: Emergency Medicine | Admitting: Emergency Medicine

## 2015-11-20 DIAGNOSIS — Y9389 Activity, other specified: Secondary | ICD-10-CM | POA: Diagnosis not present

## 2015-11-20 DIAGNOSIS — S0990XA Unspecified injury of head, initial encounter: Secondary | ICD-10-CM | POA: Diagnosis present

## 2015-11-20 DIAGNOSIS — F0781 Postconcussional syndrome: Secondary | ICD-10-CM | POA: Diagnosis not present

## 2015-11-20 DIAGNOSIS — Z791 Long term (current) use of non-steroidal anti-inflammatories (NSAID): Secondary | ICD-10-CM | POA: Insufficient documentation

## 2015-11-20 DIAGNOSIS — Z79899 Other long term (current) drug therapy: Secondary | ICD-10-CM | POA: Insufficient documentation

## 2015-11-20 DIAGNOSIS — Y998 Other external cause status: Secondary | ICD-10-CM | POA: Insufficient documentation

## 2015-11-20 DIAGNOSIS — Y9289 Other specified places as the place of occurrence of the external cause: Secondary | ICD-10-CM | POA: Insufficient documentation

## 2015-11-20 DIAGNOSIS — Z87891 Personal history of nicotine dependence: Secondary | ICD-10-CM | POA: Diagnosis not present
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
## 2015-11-20 NOTE — Discharge Instructions (Signed)
Post-Concussion Syndrome Post-concussion syndrome is the symptoms that can occur after a head injury. These symptoms can last from weeks to months. HOME CARE   Take medicines only as told by your doctor.  Do not take aspirin.  Sleep with your head raised to help with headaches.  Avoid activities that can cause another head injury.  Do not play contact sports like football, hockey, soccer, or basketball.  Do not do other risky activities like downhill skiing, martial arts, or horseback riding until your doctor says it is okay.  Keep all follow-up visits as told by your doctor. This is important. GET HELP IF:   You have a harder time:  Paying attention.  Focusing.  Remembering.  Learning new information.  Dealing with stress.  You need more time to complete tasks.  You are easily bothered (irritable).  You have more symptoms. Get help if you have any of these symptoms for more than two weeks after your injury:   Long-lasting (chronic) headaches.  Dizziness.  Trouble balancing.  Feeling sick to your stomach (nauseous).  Trouble with your vision.  Noise or light bothers you more.  Depression.  Mood swings.  Feeling worried (anxious).  Easily bothered.  Memory problems.  Trouble concentrating or paying attention.  Sleep problems.  Feeling tired all of the time. GET HELP RIGHT AWAY IF:  You feel confused.  You feel very sleepy.  You are hard to wake up.  You feel sick to your stomach.  You keep throwing up (vomiting).  You feel like you are moving when you are not (vertigo).  Your eyes move back and forth very quickly.  You start shaking (convulsing) or pass out (faint).  You have very bad headaches that do not get better with medicine.  You cannot use your arms or legs like normal.  One of the black centers of your eyes (pupils) is bigger than the other.  You have clear or bloody fluid coming from your nose or ears.  Your problems  get worse, not better. MAKE SURE YOU:  Understand these instructions.  Will watch your condition.  Will get help right away if you are not doing well or get worse.   This information is not intended to replace advice given to you by your health care provider. Make sure you discuss any questions you have with your health care provider.  Follow-up with neurology if her symptoms don't improve. Take ibuProfen as needed for pain. Return to the emergency department if you experience severe worsening of her symptoms, increased dizziness, loss of consciousness, blurry vision, numbness or tingling in extremity, vomiting.
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
## 2016-02-17 ENCOUNTER — Encounter (HOSPITAL_COMMUNITY): Payer: Self-pay

## 2016-02-17 ENCOUNTER — Ambulatory Visit (HOSPITAL_COMMUNITY)
Admission: EM | Admit: 2016-02-17 | Discharge: 2016-02-17 | Disposition: A | Discharge status: Home / Self Care | Payer: Medicaid Other | Attending: Family Medicine | Admitting: Family Medicine

## 2016-02-17 DIAGNOSIS — Z3202 Encounter for pregnancy test, result negative: Secondary | ICD-10-CM | POA: Diagnosis not present

## 2016-02-17 LAB — POCT URINALYSIS DIP (DEVICE)
BILIRUBIN URINE: NEGATIVE
GLUCOSE, UA: NEGATIVE mg/dL
Hgb urine dipstick: NEGATIVE
Ketones, ur: NEGATIVE mg/dL
Leukocytes, UA: NEGATIVE
NITRITE: NEGATIVE
Protein, ur: NEGATIVE mg/dL
Specific Gravity, Urine: 1.015 (ref 1.005–1.030)
UROBILINOGEN UA: 0.2 mg/dL (ref 0.0–1.0)
pH: 7 (ref 5.0–8.0)

## 2016-02-17 LAB — POCT PREGNANCY, URINE: PREG TEST UR: NEGATIVE

## 2016-02-17 NOTE — ED Provider Notes (Signed)
CSN: [id number]     Arrival date & time 02/17/16  1523 History   First MD Initiated Contact with Patient 02/17/16 1706     Chief Complaint  Patient presents with  . Possible Pregnancy   (Consider location/radiation/quality/duration/timing/severity/associated sxs/prior Treatment) HPI Missed period, wants to have pregnancy test.  No other complaints.  History reviewed. No pertinent past medical history. History reviewed. No pertinent past surgical history. No family history on file. Social History  Substance Use Topics  . Smoking status: Former Smoker    Types: Cigars  . Smokeless tobacco: Never Used  . Alcohol Use: No   OB History    No data available     Review of Systems Pregnancy test, missed menses.  Allergies  Review of patient's allergies indicates no known allergies.  Home Medications   Prior to Admission medications   Medication Sig Start Date End Date Taking? Authorizing Provider  ibuprofen (ADVIL,MOTRIN) 800 MG tablet Take 1 tablet (800 mg total) by mouth every 6 (six) hours as needed. 05/19/15  Yes Marisa Severin, MD  doxycycline (VIBRA-TABS) 100 MG tablet Take 1 tablet (100 mg total) by mouth 2 (two) times daily. Patient not taking: Reported on 08/30/2015 05/19/15   Marisa Severin, MD  ketorolac (TORADOL) 10 MG tablet Take 1 tablet (10 mg total) by mouth every 6 (six) hours as needed. 11/05/15   Tharon Aquas, PA  metroNIDAZOLE (FLAGYL) 500 MG tablet Take 1 tablet (500 mg total) by mouth 2 (two) times daily. 05/19/15   Marisa Severin, MD  ondansetron (ZOFRAN) 4 MG tablet Take 1 tablet (4 mg total) by mouth every 6 (six) hours. 11/05/15   Tharon Aquas, PA  sulfamethoxazole-trimethoprim (BACTRIM DS,SEPTRA DS) 800-160 MG per tablet Take 1 tablet by mouth 2 (two) times daily. Patient not taking: Reported on 05/19/2015 02/20/15   Duane Lope, NP  traMADol (ULTRAM) 50 MG tablet Take 1 tablet (50 mg total) by mouth every 6 (six) hours as needed for moderate pain or severe pain.  05/19/15   Marisa Severin, MD   Meds Ordered and Administered this Visit  Medications - No data to display  BP 125/78 mmHg  Pulse 76  Temp(Src) 99.1 F (37.3 C) (Oral)  Resp 16  SpO2 100%  LMP 01/01/2016 (Exact Date) No data found.   Physical Exam NURSES NOTES AND VITAL SIGNS REVIEWED. CONSTITUTIONAL: Well developed, well nourished, no acute distress HEENT: normocephalic, atraumatic EYES: Conjunctiva normal NECK:normal ROM, supple, no adenopathy PULMONARY:No respiratory distress, normal effort MUSCULOSKELETAL: Normal ROM of all extremities,  SKIN: warm and dry without rash PSYCHIATRIC: Mood and affect, behavior are normal  ED Course  Procedures (including critical care time)  Labs Review Labs Reviewed  POCT PREGNANCY, URINE  POCT URINALYSIS DIP (DEVICE)   Negative Imaging Review No results found.   Visual Acuity Review  Right Eye Distance:   Left Eye Distance:   Bilateral Distance:    Right Eye Near:   Left Eye Near:    Bilateral Near:         MDM   1. Pregnancy test negative     Patient is reassured that there are no issues that require transfer to higher level of care at this time or additional tests. Patient is advised to continue home symptomatic treatment. Patient is advised that if there are new or worsening symptoms to attend the emergency department, contact primary care provider, or return to UC. Instructions of care provided discharged home in stable condition.    THIS  NOTE WAS GENERATED USING A VOICE RECOGNITION SOFTWARE PROGRAM. ALL REASONABLE EFFORTS  WERE MADE TO PROOFREAD THIS DOCUMENT FOR ACCURACY.  I have verbally reviewed the discharge instructions with the patient. A printed AVS was given to the patient.  All questions were answered prior to discharge.      Tharon AquasFrank C Patrick, PA 02/17/16 1911

## 2016-02-17 NOTE — Discharge Instructions (Signed)
Pregnancy Test Information °WHAT IS A PREGNANCY TEST? °A pregnancy test is used to detect the presence of human chorionic gonadotropin (hCG) in a sample of your urine or blood. hCG is a hormone produced by the cells of the placenta. The placenta is the organ that forms to nourish and support a developing baby. °This test requires a sample of either blood or urine. A pregnancy test determines whether you are pregnant or not. °HOW ARE PREGNANCY TESTS DONE? °Pregnancy tests are done using a home pregnancy test or having a blood or urine test done at your health care provider's office.  °Home pregnancy tests require a urine sample. °· Most kits use a plastic testing device with a strip of paper that indicates whether there is hCG in your urine. °· Follow the test instructions very carefully. °· After you urinate on the test stick, markings will appear to let you know whether you are pregnant. °· For best results, use your first urine of the morning. That is when the concentration of hCG is highest. °Having a blood test to check for pregnancy requires a sample of blood drawn from a vein in your hand or arm. Your health care provider will send your sample to a lab for testing. Results of a pregnancy test will be positive or negative. °IS ONE TYPE OF PREGNANCY TEST BETTER THAN ANOTHER? °In some cases, a blood test will return a positive result even if a urine test was negative because blood tests are more sensitive. This means blood tests can detect hCG earlier than home pregnancy tests.  °HOW ACCURATE ARE HOME PREGNANCY TESTS?  °Both types of pregnancy tests are very accurate. °· A blood test is about 98% accurate. °· When you are far enough along in your pregnancy and when used correctly, home pregnancy tests are equally accurate. °CAN ANYTHING INTERFERE WITH HOME PREGNANCY TEST RESULTS?  °It is possible for certain conditions to cause an inaccurate test result (false positive or false negative). °· A false positive is a  positive test result when you are not pregnant. This can happen if you: °¨ Are taking certain medicines, including anticonvulsants or tranquilizers. °¨ Have certain proteins in your blood. °· A false negative is a negative test result when you are pregnant. This can happen if you: °¨ Took the test before there was enough hCG to detect. A pregnancy test will not be positive in most women until 3-4 weeks after conception. °¨ Drank a lot of liquid before the test. Diluted urine samples can sometimes give an inaccurate result. °¨ Take certain medicines, such as water pills (diuretics) or some antihistamines. °WHAT SHOULD I DO IF I HAVE A POSITIVE PREGNANCY TEST? °If you have a positive pregnancy test, schedule an appointment with your health care provider. You might need additional testing to confirm the pregnancy. In the meantime, begin taking a prenatal vitamin, stop smoking, stop drinking alcohol, and do not use street drugs. °Talk to your health care provider about how to take care of yourself during your pregnancy. Ask about what to expect from the care you will need throughout pregnancy (prenatal care). °  °This information is not intended to replace advice given to you by your health care provider. Make sure you discuss any questions you have with your health care provider. °  °Document Released: 10/19/2003 Document Revised: 11/06/2014 Document Reviewed: 02/10/2014 °Elsevier Interactive Patient Education ©2016 [employer] Inc. ° °

## 2016-02-17 NOTE — ED Notes (Signed)
Patient presents with a possible pregnancy she has missed 1 menstrual cycle, frequent urination and fatigue. No acute distress

## 2016-02-28 ENCOUNTER — Inpatient Hospital Stay (HOSPITAL_COMMUNITY)
Admission: AD | Admit: 2016-02-28 | Discharge: 2016-02-28 | Disposition: A | Discharge status: Home / Self Care | Payer: Medicaid Other | Source: Ambulatory Visit | Attending: Family Medicine | Admitting: Family Medicine

## 2016-02-28 DIAGNOSIS — N912 Amenorrhea, unspecified: Secondary | ICD-10-CM

## 2016-02-28 NOTE — MAU Note (Signed)
Pt reports she has had 2 positive home preg test and just wants to verify her pregnancy. Denies pain or bleeding.

## 2016-02-28 NOTE — MAU Provider Note (Signed)
S:   20 y.o. No obstetric history on file. @Unknown  by LMP presents to MAU for pregnancy confirmation.  She denies abdominal pain or vaginal bleeding today.   Pt reports she has had 2 positive home preg test and just wants to verify her pregnancy. Denies pain or bleeding.           O: BP 129/73 mmHg  Pulse 96  Temp(Src) 98.5 F (36.9 C) (Oral)  Resp 16  Ht 5\' 3"  (1.6 m)  Wt 126 lb (57.153 kg)  BMI 22.33 kg/m2  SpO2 100%  LMP 01/29/2016 Physical Examination: General appearance - alert, well appearing, and in no distress, oriented to person, place, and time and acyanotic, in no respiratory distress   A: Positive pregnancy test at home  P: D/C home Informed she should go to clinic tomorrow morning between 8am and 4pm for pregnancy verification  Laneah Luft, CNM 8:07 PM

## 2016-02-29 ENCOUNTER — Ambulatory Visit (INDEPENDENT_AMBULATORY_CARE_PROVIDER_SITE_OTHER): Payer: Medicaid Other

## 2016-02-29 ENCOUNTER — Encounter: Payer: Self-pay | Admitting: Family Medicine

## 2016-02-29 DIAGNOSIS — Z3201 Encounter for pregnancy test, result positive: Secondary | ICD-10-CM | POA: Diagnosis present

## 2016-02-29 DIAGNOSIS — Z3200 Encounter for pregnancy test, result unknown: Secondary | ICD-10-CM

## 2016-02-29 LAB — POCT PREGNANCY, URINE: Preg Test, Ur: POSITIVE — AB

## 2016-02-29 NOTE — Progress Notes (Signed)
Pt presents today for a pregnancy test. Test has confirm she is pregnant at this time. Pt will follow up with her ob gyn of choice.

## 2016-03-01 ENCOUNTER — Inpatient Hospital Stay (HOSPITAL_COMMUNITY): Payer: Medicaid Other

## 2016-03-01 ENCOUNTER — Inpatient Hospital Stay (HOSPITAL_COMMUNITY)
Admission: AD | Admit: 2016-03-01 | Discharge: 2016-03-01 | Disposition: A | Discharge status: Home / Self Care | Payer: Medicaid Other | Source: Ambulatory Visit | Attending: Obstetrics & Gynecology | Admitting: Obstetrics & Gynecology

## 2016-03-01 ENCOUNTER — Encounter (HOSPITAL_COMMUNITY): Payer: Self-pay | Admitting: *Deleted

## 2016-03-01 DIAGNOSIS — B9689 Other specified bacterial agents as the cause of diseases classified elsewhere: Secondary | ICD-10-CM | POA: Insufficient documentation

## 2016-03-01 DIAGNOSIS — A499 Bacterial infection, unspecified: Secondary | ICD-10-CM | POA: Diagnosis not present

## 2016-03-01 DIAGNOSIS — O9989 Other specified diseases and conditions complicating pregnancy, childbirth and the puerperium: Secondary | ICD-10-CM | POA: Diagnosis not present

## 2016-03-01 DIAGNOSIS — Z3A01 Less than 8 weeks gestation of pregnancy: Secondary | ICD-10-CM | POA: Insufficient documentation

## 2016-03-01 DIAGNOSIS — O23591 Infection of other part of genital tract in pregnancy, first trimester: Secondary | ICD-10-CM | POA: Diagnosis not present

## 2016-03-01 DIAGNOSIS — R1030 Lower abdominal pain, unspecified: Secondary | ICD-10-CM | POA: Diagnosis present

## 2016-03-01 DIAGNOSIS — O26891 Other specified pregnancy related conditions, first trimester: Secondary | ICD-10-CM | POA: Diagnosis not present

## 2016-03-01 DIAGNOSIS — Z87891 Personal history of nicotine dependence: Secondary | ICD-10-CM | POA: Insufficient documentation

## 2016-03-01 DIAGNOSIS — O3680X0 Pregnancy with inconclusive fetal viability, not applicable or unspecified: Secondary | ICD-10-CM

## 2016-03-01 DIAGNOSIS — N760 Acute vaginitis: Secondary | ICD-10-CM

## 2016-03-01 DIAGNOSIS — R109 Unspecified abdominal pain: Secondary | ICD-10-CM

## 2016-03-01 DIAGNOSIS — O26899 Other specified pregnancy related conditions, unspecified trimester: Secondary | ICD-10-CM

## 2016-03-01 LAB — CBC
HCT: 32.8 % — ABNORMAL LOW (ref 36.0–46.0)
Hemoglobin: 10.9 g/dL — ABNORMAL LOW (ref 12.0–15.0)
MCH: 29.6 pg (ref 26.0–34.0)
MCHC: 33.2 g/dL (ref 30.0–36.0)
MCV: 89.1 fL (ref 78.0–100.0)
PLATELETS: 159 10*3/uL (ref 150–400)
RBC: 3.68 MIL/uL — AB (ref 3.87–5.11)
RDW: 14.2 % (ref 11.5–15.5)
WBC: 4.3 10*3/uL (ref 4.0–10.5)

## 2016-03-01 LAB — URINALYSIS, ROUTINE W REFLEX MICROSCOPIC
BILIRUBIN URINE: NEGATIVE
Glucose, UA: NEGATIVE mg/dL
Hgb urine dipstick: NEGATIVE
Ketones, ur: NEGATIVE mg/dL
LEUKOCYTES UA: NEGATIVE
NITRITE: NEGATIVE
Protein, ur: NEGATIVE mg/dL
SPECIFIC GRAVITY, URINE: 1.025 (ref 1.005–1.030)
pH: 5.5 (ref 5.0–8.0)

## 2016-03-01 LAB — ABO/RH: ABO/RH(D): O POS

## 2016-03-01 LAB — WET PREP, GENITAL
SPERM: NONE SEEN
TRICH WET PREP: NONE SEEN
YEAST WET PREP: NONE SEEN

## 2016-03-01 LAB — HCG, QUANTITATIVE, PREGNANCY: hCG, Beta Chain, Quant, S: 76 m[IU]/mL — ABNORMAL HIGH (ref <5)

## 2016-03-01 MED ORDER — METRONIDAZOLE 500 MG PO TABS: 500.0000 mg | ORAL_TABLET | Freq: Two times a day (BID) | ORAL | Status: DC

## 2016-03-01 NOTE — MAU Note (Signed)
Pt c/o abdominal pain for the last couple days that is severe. It feels like a bad menstrual cramp. Pt denies bleeding.

## 2016-03-01 NOTE — MAU Provider Note (Signed)
History     CSN: [id number]  Arrival date and time: 03/01/16 1702   First Provider Initiated Contact with Patient 03/01/16 1742       Chief Complaint  Patient presents with  . Abdominal Pain   HPI  Patricia Butler is a 20 y.o. G1P0 at [redacted]w[redacted]d by LMP who presents with complaints of abdominal pain. [facility] Reports lower abdominal pain x 4 days. Intermittent pain that she describes as sharp & cramp like. Rates pain 6/10. No treatment.  Some nausea; no vomiting.  Denies vaginal bleeding, diarrhea, constipation, dysuria.  Feels like underwear is "wetter than normal" but hasn't noticed discharge, irritation, or odor.   OB History    Gravida Para Term Preterm AB TAB SAB Ectopic Multiple Living   1               Past Medical History  Diagnosis Date  . Medical history non-contributory     Past Surgical History  Procedure Laterality Date  . No past surgeries      Family History  Problem Relation Age of Onset  . Diabetes Paternal Grandmother     Social History  Substance Use Topics  . Smoking status: Former Smoker    Types: Cigars  . Smokeless tobacco: Never Used  . Alcohol Use: No    Allergies: No Known Allergies  Prescriptions prior to admission  Medication Sig Dispense Refill Last Dose  . ketorolac (TORADOL) 10 MG tablet Take 1 tablet (10 mg total) by mouth every 6 (six) hours as needed. (Patient not taking: Reported on 02/29/2016) 20 tablet 0 Not Taking at Unknown time  . ondansetron (ZOFRAN) 4 MG tablet Take 1 tablet (4 mg total) by mouth every 6 (six) hours. (Patient not taking: Reported on 02/29/2016) 12 tablet 0 Not Taking at Unknown time    Review of Systems  Constitutional: Negative.   Gastrointestinal: Positive for nausea and abdominal pain. Negative for vomiting, diarrhea and constipation.  Genitourinary: Negative for dysuria.       + vaginal discharge No vaginal bleeding   Physical Exam   Blood pressure 130/75,  pulse 96, temperature 98.5 F (36.9 C), temperature source Oral, resp. rate 18, height 5\' 3"  (1.6 m), weight 126 lb (57.153 kg), last menstrual period 01/29/2016, SpO2 100 %.  Physical Exam  Nursing note and vitals reviewed. Constitutional: She is oriented to person, place, and time. She appears well-developed and well-nourished. No distress.  HENT:  Head: Normocephalic and atraumatic.  Eyes: Conjunctivae are normal. Right eye exhibits no discharge. Left eye exhibits no discharge. No scleral icterus.  Neck: Normal range of motion.  Cardiovascular: Normal rate, regular rhythm and normal heart sounds.   No murmur heard. Respiratory: Effort normal and breath sounds normal. No respiratory distress. She has no wheezes.  GI: Soft. Bowel sounds are normal. She exhibits no distension. There is no tenderness.  Genitourinary: Uterus normal. Cervix exhibits no motion tenderness and no friability. Right adnexum displays no mass, no tenderness and no fullness. Left adnexum displays tenderness. Left adnexum displays no mass and no fullness (Cervix closed). No bleeding in the vagina. Vaginal discharge (small amount of thin tan discharge) found.  Neurological: She is alert and oriented to person, place, and time.  Skin: Skin is warm and dry. She is not diaphoretic.  Psychiatric: She has a normal mood and affect. Her behavior is normal. Judgment and thought content normal.    MAU Course  Procedures Results for  orders placed or performed during the hospital encounter of 03/01/16 (from the past 24 hour(s))  Urinalysis, Routine w reflex microscopic ([facility])     Status: None   Collection Time: 03/01/16  5:10 PM  Result Value Ref Range   Color, Urine YELLOW YELLOW   APPearance CLEAR CLEAR   Specific Gravity, Urine 1.025 1.005 - 1.030   pH 5.5 5.0 - 8.0   Glucose, UA NEGATIVE NEGATIVE mg/dL   Hgb urine dipstick NEGATIVE NEGATIVE   Bilirubin Urine NEGATIVE NEGATIVE   Ketones, ur NEGATIVE NEGATIVE mg/dL    Protein, ur NEGATIVE NEGATIVE mg/dL   Nitrite NEGATIVE NEGATIVE   Leukocytes, UA NEGATIVE NEGATIVE  Wet prep, genital     Status: Abnormal   Collection Time: 03/01/16  5:50 PM  Result Value Ref Range   Yeast Wet Prep HPF POC NONE SEEN NONE SEEN   Trich, Wet Prep NONE SEEN NONE SEEN   Clue Cells Wet Prep HPF POC PRESENT (A) NONE SEEN   WBC, Wet Prep HPF POC MODERATE (A) NONE SEEN   Sperm NONE SEEN   CBC     Status: Abnormal   Collection Time: 03/01/16  6:15 PM  Result Value Ref Range   WBC 4.3 4.0 - 10.5 K/uL   RBC 3.68 (L) 3.87 - 5.11 MIL/uL   Hemoglobin 10.9 (L) 12.0 - 15.0 g/dL   HCT 16.132.8 (L) 09.636.0 - 04.546.0 %   MCV 89.1 78.0 - 100.0 fL   MCH 29.6 26.0 - 34.0 pg   MCHC 33.2 30.0 - 36.0 g/dL   RDW 40.914.2 81.111.5 - 91.415.5 %   Platelets 159 150 - 400 K/uL  ABO/Rh     Status: None (Preliminary result)   Collection Time: 03/01/16  6:15 PM  Result Value Ref Range   ABO/RH(D) O POS   hCG, quantitative, pregnancy     Status: Abnormal   Collection Time: 03/01/16  6:15 PM  Result Value Ref Range   hCG, Beta Chain, Quant, S 76 (H) <5 mIU/mL   [place] Ob Comp Less 14 Wks  03/01/2016  CLINICAL DATA:  Abdominal and pelvic cramping. Gestational age by a last menstrual period - 4 weeks, 4 days. EXAM: OBSTETRIC <14 WK US AND TRANSVAGINAL OB US TECHNIQUE: Both transabdominal and transvaginal ultrasound examinations were performed for complete evaluation of the gestation as well as the maternal uterus, adnexal regions, and pelvic cul-de-sac. Transvaginal technique was performed to assess early pregnancy. COMPARISON:  None. FINDINGS: Intrauterine gestational sac: Not visualized Maternal uterus/adnexae: Normal appearance of the anteverted uterus. No discrete uterine mass. Endometrium is mildly thickened measuring 2 cm in diameter (image 25). Normal size and appearance of the left ovary measuring 2.7 x 1.9 x 2.1 cm. Several tiny follicles are noted within the periphery of the left ovary. No discrete left ovarian or  adnexal mass. Normal size and appearance of the right ovary, measuring 2.7 x 1.9 x 1.9 cm. Several tiny follicles are noted within the periphery of the right ovary. No discrete right ovarian or adnexal mass. A trace amount of free fluid in the pelvic cul-de-sac. IMPRESSION: 1. Non visualization of an intrauterine gestation. Differential considerations include failed intrauterine gestation, early viable though not yet visualized intrauterine gestation and ectopic pregnancy. As such, further evaluation with serial beta hCGs and follow-up pelvic ultrasound is recommended. 2. Otherwise, no explanation for patient's lower abdominal/pelvic cramping. Electronically Signed   By: Simonne ComeJohn  Watts M.D.   On: 03/01/2016 18:49   [place] Ob Transvaginal  03/01/2016  CLINICAL DATA:  Abdominal and pelvic cramping. Gestational age by a last menstrual period - 4 weeks, 4 days. EXAM: OBSTETRIC <14 WK [place] AND TRANSVAGINAL OB US TECHNIQUE: Both transabdominal and transvaginal ultrasound examinations were performed for complete evaluation of the gestation as well as the maternal uterus, adnexal regions, and pelvic cul-de-sac. Transvaginal technique was performed to assess early pregnancy. COMPARISON:  None. FINDINGS: Intrauterine gestational sac: Not visualized Maternal uterus/adnexae: Normal appearance of the anteverted uterus. No discrete uterine mass. Endometrium is mildly thickened measuring 2 cm in diameter (image 25). Normal size and appearance of the left ovary measuring 2.7 x 1.9 x 2.1 cm. Several tiny follicles are noted within the periphery of the left ovary. No discrete left ovarian or adnexal mass. Normal size and appearance of the right ovary, measuring 2.7 x 1.9 x 1.9 cm. Several tiny follicles are noted within the periphery of the right ovary. No discrete right ovarian or adnexal mass. A trace amount of free fluid in the pelvic cul-de-sac. IMPRESSION: 1. Non visualization of an intrauterine gestation. Differential considerations  include failed intrauterine gestation, early viable though not yet visualized intrauterine gestation and ectopic pregnancy. As such, further evaluation with serial beta hCGs and follow-up pelvic ultrasound is recommended. 2. Otherwise, no explanation for patient's lower abdominal/pelvic cramping. Electronically Signed   By: Simonne Come M.D.   On: 03/01/2016 18:49    MDM CBC, HIV, BHCG, GC/CT, wet prep Ultrasound - no iup or ectopic BHCG 76 Will have patient f/u in MAU for BHCG on Friday afternoon  Assessment and Plan  A: 1. Pregnancy of unknown anatomic location   2. Abdominal pain affecting pregnancy   3. BV (bacterial vaginosis)     P; Discharge home Rx flagyl Return to MAU on Friday or sooner as needed Ectopic precautions given  Judeth Horn 03/01/2016, 5:41 PM

## 2016-03-01 NOTE — Discharge Instructions (Signed)
Abdominal Pain During Pregnancy °Abdominal pain is common in pregnancy. Most of the time, it does not cause harm. There are many causes of abdominal pain. Some causes are more serious than others. Some of the causes of abdominal pain in pregnancy are easily diagnosed. Occasionally, the diagnosis takes time to understand. Other times, the cause is not determined. Abdominal pain can be a sign that something is very wrong with the pregnancy, or the pain may have nothing to do with the pregnancy at all. For this reason, always tell your health care provider if you have any abdominal discomfort. °HOME CARE INSTRUCTIONS  °Monitor your abdominal pain for any changes. The following actions may help to alleviate any discomfort you are experiencing: °· Do not have sexual intercourse or put anything in your vagina until your symptoms go away completely. °· Get plenty of rest until your pain improves. °· Drink clear fluids if you feel nauseous. Avoid solid food as long as you are uncomfortable or nauseous. °· Only take over-the-counter or prescription medicine as directed by your health care provider. °· Keep all follow-up appointments with your health care provider. °SEEK IMMEDIATE MEDICAL CARE IF: °· You are bleeding, leaking fluid, or passing tissue from the vagina. °· You have increasing pain or cramping. °· You have persistent vomiting. °· You have painful or bloody urination. °· You have a fever. °· You notice a decrease in your baby's movements. °· You have extreme weakness or feel faint. °· You have shortness of breath, with or without abdominal pain. °· You develop a severe headache with abdominal pain. °· You have abnormal vaginal discharge with abdominal pain. °· You have persistent diarrhea. °· You have abdominal pain that continues even after rest, or gets worse. °MAKE SURE YOU:  °· Understand these instructions. °· Will watch your condition. °· Will get help right away if you are not doing well or get worse. °    °This information is not intended to replace advice given to you by your health care provider. Make sure you discuss any questions you have with your health care provider. °  °Document Released: 10/16/2005 Document Revised: 08/06/2013 Document Reviewed: 05/15/2013 °Elsevier Interactive Patient Education ©2016 Else[employer] Inc. ° ° ° ° °Safe Medications in Pregnancy  ° °Acne: °Benzoyl Peroxide °Salicylic Acid ° °Backache/Headache: °Tylenol: 2 regular strength every 4 hours OR °             2 Extra strength every 6 hours ° °Colds/Coughs/Allergies: °Benadryl (alcohol free) 25 mg every 6 hours as needed °Breath right strips °Claritin °Cepacol throat lozenges °Chloraseptic throat spray °Cold-Eeze- up to three times per day °Cough drops, alcohol free °Flonase (by prescription only) °Guaifenesin °Mucinex °Robitussin DM (plain only, alcohol free) °Saline nasal spray/drops °Sudafed (pseudoephedrine) & Actifed ** use only after [redacted] weeks gestation and if you do not have high blood pressure °Tylenol °Vicks Vaporub °Zinc lozenges °Zyrtec  ° °Constipation: °Colace °Ducolax suppositories °Fleet enema °Glycerin suppositories °Metamucil °Milk of magnesia °Miralax °Senokot °Smooth move tea ° °Diarrhea: °Kaopectate °Imodium A-D ° °*NO pepto Bismol ° °Hemorrhoids: °Anusol °Anusol HC °Preparation H °Tucks ° °Indigestion: °Tums °Maalox °Mylanta °Zantac  °Pepcid ° °Insomnia: °Benadryl (alcohol free) 25mg every 6 hours as needed °Tylenol PM °Unisom, no Gelcaps ° °Leg Cramps: °Tums °MagGel ° °Nausea/Vomiting:  °Bonine °Dramamine °Emetrol °Ginger extract °Sea bands °Meclizine  °Nausea medication to take during pregnancy:  °Unisom (doxylamine succinate 25 mg tablets) Take one tablet daily at bedtime. If symptoms are not adequately controlled, the dose   can be increased to a maximum recommended dose of two tablets daily (1/2 tablet in the morning, 1/2 tablet mid-afternoon and one at bedtime). °Vitamin B6 100mg tablets. Take one tablet twice a day  (up to 200 mg per day). ° °Skin Rashes: °Aveeno products °Benadryl cream or 25mg every 6 hours as needed °Calamine Lotion °1% cortisone cream ° °Yeast infection: °Gyne-lotrimin 7 °Monistat 7 ° °Gum/tooth pain: °Anbesol ° °**If taking multiple medications, please check labels to avoid duplicating the same active ingredients °**take medication as directed on the label °** Do not exceed 4000 mg of tylenol in 24 hours °**Do not take medications that contain aspirin or ibuprofen ° ° ° ° °

## 2016-03-02 ENCOUNTER — Telehealth (HOSPITAL_COMMUNITY): Payer: Self-pay | Admitting: *Deleted

## 2016-03-02 DIAGNOSIS — O98811 Other maternal infectious and parasitic diseases complicating pregnancy, first trimester: Principal | ICD-10-CM

## 2016-03-02 DIAGNOSIS — A749 Chlamydial infection, unspecified: Secondary | ICD-10-CM

## 2016-03-02 LAB — HIV ANTIBODY (ROUTINE TESTING W REFLEX): HIV Screen 4th Generation wRfx: NONREACTIVE

## 2016-03-02 LAB — GC/CHLAMYDIA PROBE AMP (~~LOC~~) NOT AT ARMC
Chlamydia: POSITIVE — AB
NEISSERIA GONORRHEA: NEGATIVE

## 2016-03-02 MED ORDER — AZITHROMYCIN 500 MG PO TABS: ORAL_TABLET | ORAL | Status: DC

## 2016-03-02 NOTE — Telephone Encounter (Signed)
Telephone call to patient regarding positive chlamydia culture, patient notified.  Rx routed to pharmacy per protocol.  Instructed patient to notify her partner for treatment and to abstain from sex for seven days post treatment.  Report faxed to health department.  

## 2016-03-03 ENCOUNTER — Encounter (HOSPITAL_COMMUNITY): Payer: Self-pay

## 2016-03-03 ENCOUNTER — Emergency Department (HOSPITAL_COMMUNITY)
Admission: EM | Admit: 2016-03-03 | Discharge: 2016-03-03 | Disposition: A | Discharge status: Home / Self Care | Payer: Medicaid Other | Attending: Emergency Medicine | Admitting: Emergency Medicine

## 2016-03-03 ENCOUNTER — Inpatient Hospital Stay (EMERGENCY_DEPARTMENT_HOSPITAL)
Admission: AD | Admit: 2016-03-03 | Discharge: 2016-03-03 | Disposition: A | Discharge status: Home / Self Care | Payer: Medicaid Other | Source: Ambulatory Visit | Attending: Obstetrics & Gynecology | Admitting: Obstetrics & Gynecology

## 2016-03-03 DIAGNOSIS — Z8619 Personal history of other infectious and parasitic diseases: Secondary | ICD-10-CM | POA: Diagnosis not present

## 2016-03-03 DIAGNOSIS — R109 Unspecified abdominal pain: Secondary | ICD-10-CM

## 2016-03-03 DIAGNOSIS — O039 Complete or unspecified spontaneous abortion without complication: Secondary | ICD-10-CM | POA: Insufficient documentation

## 2016-03-03 DIAGNOSIS — O209 Hemorrhage in early pregnancy, unspecified: Secondary | ICD-10-CM | POA: Diagnosis present

## 2016-03-03 DIAGNOSIS — Z3A01 Less than 8 weeks gestation of pregnancy: Secondary | ICD-10-CM

## 2016-03-03 DIAGNOSIS — Z792 Long term (current) use of antibiotics: Secondary | ICD-10-CM | POA: Insufficient documentation

## 2016-03-03 DIAGNOSIS — O26891 Other specified pregnancy related conditions, first trimester: Secondary | ICD-10-CM

## 2016-03-03 DIAGNOSIS — Z87891 Personal history of nicotine dependence: Secondary | ICD-10-CM | POA: Insufficient documentation

## 2016-03-03 DIAGNOSIS — R110 Nausea: Secondary | ICD-10-CM | POA: Diagnosis not present

## 2016-03-03 DIAGNOSIS — O200 Threatened abortion: Secondary | ICD-10-CM

## 2016-03-03 DIAGNOSIS — O9989 Other specified diseases and conditions complicating pregnancy, childbirth and the puerperium: Secondary | ICD-10-CM | POA: Diagnosis not present

## 2016-03-03 HISTORY — DX: Chlamydial infection, unspecified: A74.9

## 2016-03-03 LAB — CBC WITH DIFFERENTIAL/PLATELET
BASOS PCT: 1 %
Basophils Absolute: 0 10*3/uL (ref 0.0–0.1)
EOS ABS: 0 10*3/uL (ref 0.0–0.7)
Eosinophils Relative: 1 %
HCT: 35.5 % — ABNORMAL LOW (ref 36.0–46.0)
HEMOGLOBIN: 11.6 g/dL — AB (ref 12.0–15.0)
Lymphocytes Relative: 41 %
Lymphs Abs: 1.9 10*3/uL (ref 0.7–4.0)
MCH: 29.7 pg (ref 26.0–34.0)
MCHC: 32.7 g/dL (ref 30.0–36.0)
MCV: 90.8 fL (ref 78.0–100.0)
Monocytes Absolute: 0.4 10*3/uL (ref 0.1–1.0)
Monocytes Relative: 9 %
NEUTROS PCT: 48 %
Neutro Abs: 2.2 10*3/uL (ref 1.7–7.7)
Platelets: 168 10*3/uL (ref 150–400)
RBC: 3.91 MIL/uL (ref 3.87–5.11)
RDW: 14 % (ref 11.5–15.5)
WBC: 4.5 10*3/uL (ref 4.0–10.5)

## 2016-03-03 LAB — HCG, QUANTITATIVE, PREGNANCY
hCG, Beta Chain, Quant, S: 40 m[IU]/mL — ABNORMAL HIGH (ref <5)
hCG, Beta Chain, Quant, S: 32 m[IU]/mL — ABNORMAL HIGH (ref <5)

## 2016-03-03 NOTE — Discharge Instructions (Signed)
Miscarriage  A miscarriage is the sudden loss of an unborn baby (fetus) before the 20th week of pregnancy. Most miscarriages happen in the first 3 months of pregnancy. Sometimes, it happens before a woman even knows she is pregnant. A miscarriage is also called a "spontaneous miscarriage" or "early pregnancy loss." Having a miscarriage can be an emotional experience. Talk with your caregiver about any questions you may have about miscarrying, the grieving process, and your future pregnancy plans.  CAUSES    Problems with the fetal chromosomes that make it impossible for the baby to develop normally. Problems with the baby's genes or chromosomes are most often the result of errors that occur, by chance, as the embryo divides and grows. The problems are not inherited from the parents.   Infection of the cervix or uterus.    Hormone problems.    Problems with the cervix, such as having an incompetent cervix. This is when the tissue in the cervix is not strong enough to hold the pregnancy.    Problems with the uterus, such as an abnormally shaped uterus, uterine fibroids, or congenital abnormalities.    Certain medical conditions.    Smoking, drinking alcohol, or taking illegal drugs.    Trauma.   Often, the cause of a miscarriage is unknown.   SYMPTOMS    Vaginal bleeding or spotting, with or without cramps or pain.   Pain or cramping in the abdomen or lower back.   Passing fluid, tissue, or blood clots from the vagina.  DIAGNOSIS   Your caregiver will perform a physical exam. You may also have an ultrasound to confirm the miscarriage. Blood or urine tests may also be ordered.  TREATMENT    Sometimes, treatment is not necessary if you naturally pass all the fetal tissue that was in the uterus. If some of the fetus or placenta remains in the body (incomplete miscarriage), tissue left behind may become infected and must be removed. Usually, a dilation and curettage (D and C) procedure is performed.  During a D and C procedure, the cervix is widened (dilated) and any remaining fetal or placental tissue is gently removed from the uterus.   Antibiotic medicines are prescribed if there is an infection. Other medicines may be given to reduce the size of the uterus (contract) if there is a lot of bleeding.   If you have Rh negative blood and your baby was Rh positive, you will need a Rh immunoglobulin shot. This shot will protect any future baby from having Rh blood problems in future pregnancies.  HOME CARE INSTRUCTIONS    Your caregiver may order bed rest or may allow you to continue light activity. Resume activity as directed by your caregiver.   Have someone help with home and family responsibilities during this time.    Keep track of the number of sanitary pads you use each day and how soaked (saturated) they are. Write down this information.    Do not use tampons. Do not douche or have sexual intercourse until approved by your caregiver.    Only take over-the-counter or prescription medicines for pain or discomfort as directed by your caregiver.    Do not take aspirin. Aspirin can cause bleeding.    Keep all follow-up appointments with your caregiver.    If you or your partner have problems with grieving, talk to your caregiver or seek counseling to help cope with the pregnancy loss. Allow enough time to grieve before trying to get pregnant again.     SEEK IMMEDIATE MEDICAL CARE IF:    You have severe cramps or pain in your back or abdomen.   You have a fever.   You pass large blood clots (walnut-sized or larger) ortissue from your vagina. Save any tissue for your caregiver to inspect.    Your bleeding increases.    You have a thick, bad-smelling vaginal discharge.   You become lightheaded, weak, or you faint.    You have chills.   MAKE SURE YOU:   Understand these instructions.   Will watch your condition.   Will get help right away if you are not doing well or get worse.     This  information is not intended to replace advice given to you by your health care provider. Make sure you discuss any questions you have with your health care provider.     Document Released: 04/11/2001 Document Revised: 02/10/2013 Document Reviewed: 12/05/2011  Elsevier Interactive Patient Education 2016 [employer]

## 2016-03-03 NOTE — ED Provider Notes (Signed)
CSN: [id number]     Arrival date & time 03/03/16  1830 History  By signing my name below, I, Iona BeardChristian Pulliam, attest that this documentation has been prepared under the direction and in the presence of Everlene FarrierWilliam Haisley Arens, PA-C.   Electronically Signed: Iona Beardhristian Pulliam, ED Scribe 03/03/2016 at 10:46 PM.   Chief Complaint  Patient presents with  . Vaginal Bleeding    The history is provided by the patient. No language interpreter was used.   HPI Comments: Patricia Butler is a 20 y.o. female who presents to the Emergency Department complaining of gradual onset, vaginal bleeding, onset around 5 PM today. [facility] today for a pregnancy follow-up and was informed her HCG was decreasing and they suspected miscarriage. [facility] and had ultrasound. Pt reports she has soiled one pad today. She reports mild nausea. No abdominal pain. No other associated symptoms noted. No worsening or alleviating factors noted. Pt denies abdominal pain, dysuria, urinary frequency, urinary urgency, vomiting, diarrhea, fevers, chills, cough, difficulty breathing, light-headedness, dizziness, syncope, or any other pertinent symptoms  Past Medical History  Diagnosis Date  . Medical history non-contributory   . Chlamydia    Past Surgical History  Procedure Laterality Date  . No past surgeries     Family History  Problem Relation Age of Onset  . Diabetes Paternal Grandmother    Social History  Substance Use Topics  . Smoking status: Former Smoker    Types: Cigars  . Smokeless tobacco: Never Used  . Alcohol Use: No   OB History    Gravida Para Term Preterm AB TAB SAB Ectopic Multiple Living   1              Review of Systems  Constitutional: Negative for fever and chills.  HENT: Negative for sore throat.   Eyes: Negative for visual disturbance.  Respiratory: Negative for cough and shortness of breath.   Gastrointestinal: Positive for  nausea. Negative for vomiting, abdominal pain and diarrhea.  Genitourinary: Positive for vaginal bleeding. Negative for dysuria, urgency, frequency, vaginal discharge and pelvic pain.  Skin: Negative for rash.  Neurological: Negative for dizziness, syncope, weakness and light-headedness.    Allergies  Review of patient's allergies indicates no known allergies.  Home Medications   Prior to Admission medications   Medication Sig Start Date End Date Taking? Authorizing Provider  azithromycin (ZITHROMAX) 500 MG tablet Take two tablets by mouth once. 03/02/16   Willodean Rosenthalarolyn Harraway-Smith, MD  metroNIDAZOLE (FLAGYL) 500 MG tablet Take 1 tablet (500 mg total) by mouth 2 (two) times daily. 03/01/16   Judeth HornErin Lawrence, NP   BP 119/67 mmHg  Pulse 66  Temp(Src) 98 F (36.7 C) (Oral)  Resp 18  Ht 5\' 3"  (1.6 m)  Wt 125 lb (56.7 kg)  BMI 22.15 kg/m2  SpO2 98%  LMP 01/29/2016 Physical Exam  Constitutional: She appears well-developed and well-nourished. No distress.  Nontoxic appearing.  HENT:  Head: Normocephalic and atraumatic.  Eyes: Conjunctivae are normal. Pupils are equal, round, and reactive to light. Right eye exhibits no discharge. Left eye exhibits no discharge.  Neck: Neck supple.  Cardiovascular: Normal rate, regular rhythm, normal heart sounds and intact distal pulses.   Pulmonary/Chest: Effort normal and breath sounds normal. No respiratory distress. She has no wheezes. She has no rales.  Abdominal: Soft. Bowel sounds are normal. She exhibits no distension and no mass. There is no tenderness. There is  no guarding.  Abdomen soft, non-tender.   Genitourinary:  Pelvic exam performed by me with female RN chaperone. Patient has small amount of blood in her vaginal vault.  Cervix is closed with some slight oozing of blood from the cervix. No cervical motion tenderness. No adnexal tenderness or fullness.  Lymphadenopathy:    She has no cervical adenopathy.  Neurological: She is alert.  Coordination normal.  Skin: Skin is warm and dry. No rash noted. She is not diaphoretic. No erythema. No pallor.  Psychiatric: She has a normal mood and affect. Her behavior is normal.  Nursing note and vitals reviewed.   ED Course  Procedures (including critical care time) DIAGNOSTIC STUDIES: Oxygen Saturation is 98% on RA, normal by my interpretation.    COORDINATION OF CARE: 7:49 PM Discussed treatment plan which includes pelvic exam, hCG quantitative, and CBC with differential with pt at bedside and pt agreed to plan.  Labs Review Labs Reviewed  HCG, QUANTITATIVE, PREGNANCY - Abnormal; Notable for the following:    hCG, Beta Chain, Quant, S 32 (*)    All other components within normal limits  CBC WITH DIFFERENTIAL/PLATELET - Abnormal; Notable for the following:    Hemoglobin 11.6 (*)    HCT 35.5 (*)    All other components within normal limits    Imaging Review No results found. I have personally reviewed and evaluated these lab results as part of my medical decision-making.   EKG Interpretation None      Filed Vitals:   03/03/16 1837  BP: 119/67  Pulse: 66  Temp: 98 F (36.7 C)  TempSrc: Oral  Resp: 18  Height:  (1.6 m)  Weight: 56.7 kg  SpO2: 98%     MDM   Meds given in ED:  Medications - No data to display  New Prescriptions   No medications on file    Final diagnoses:  Miscarriage   This is a 20 y.o. female who presents to the Emergency Department complaining of gradual onset, vaginal bleeding, onset around 5 PM today. Pt was seen at [facility] earlier today for a pregnancy follow-up and was informed her HCG was decreasing and they suspected miscarriage. [facility] and had ultrasound. Pt reports she has soiled one pad today. She reports mild nausea. No abdominal pain. No other associated symptoms noted. The patient Is Rh+. 2 days ago the patient was seen at [facility] hospital and had a beta hCG of  76 and an ultrasound that showed no intrauterine pregnancy. No evidence of ectopic pregnancy. She returned to the [facility] today and had a beta hCG of 40. This is reassuring. Plan was for her to follow-up in 2 days for repeat beta hCG from [facility]. Later patient developed some vaginal bleeding has soiled one pad upon arrival to the emergency department. Here her beta hCG is continued to drop and is now 32. Hemoglobin is 11.6 which is an improvement from her hemoglobin 2 days ago. This is reassuring. On pelvic exam the patient has a mild amount of blood in her vaginal vault. Cervix is closed. She has no abdominal tenderness to palpation. No cervical motion tenderness. No adnexal tenderness or fullness. She is Rh+, so no need for Rhogam.   Based on these lab results and exam I feel the patient is safe to follow-up in 2 days at the [facility] outpatient clinic for repeat beta hCG. I discussed bleeding precautions and advised to return  the emergency department if she is swelling more than 1 pad an hour or has symptoms of anemia including lightheadedness, shortness of breath or dizziness. I advised the patient to follow-up with their primary care provider this week. I advised the patient to return to the emergency department with new or worsening symptoms or new concerns. The patient verbalized understanding and agreement with plan.    I personally performed the services described in this documentation, which was scribed in my presence. The recorded information has been reviewed and is accurate.     Everlene Farrier, PA-C 03/03/16 2255  Jerelyn Scott, MD 03/03/16 2258

## 2016-03-03 NOTE — MAU Note (Signed)
Here for repeat BHCG.  No complaints offered.  Denies pain or bleeding

## 2016-03-03 NOTE — MAU Provider Note (Signed)
  History     CSN: [id number]  Arrival date and time: 03/03/16 1205   None     Chief Complaint  Patient presents with  . Follow-up   HPI Patricia Butler is 20 y.o. G1P0 5820w6d weeks presenting for follow up BHCG.  Had + UPT testing in the clinic 5/2.   Returned to MAU on 5/3 for abdominal cramping, BHCG on that date was 76.  Had + Chlamydia on that date, reported and Rx to pharmacy.  Patient states she took medication and so did her partner.  Today she denies abdominal pain and vaginal bleeding.    Past Medical History  Diagnosis Date  . Medical history non-contributory     Past Surgical History  Procedure Laterality Date  . No past surgeries      Family History  Problem Relation Age of Onset  . Diabetes Paternal Grandmother     Social History  Substance Use Topics  . Smoking status: Former Smoker    Types: Cigars  . Smokeless tobacco: Never Used  . Alcohol Use: No    Allergies: No Known Allergies  Prescriptions prior to admission  Medication Sig Dispense Refill Last Dose  . azithromycin (ZITHROMAX) 500 MG tablet Take two tablets by mouth once. 2 tablet 0   . metroNIDAZOLE (FLAGYL) 500 MG tablet Take 1 tablet (500 mg total) by mouth 2 (two) times daily. 14 tablet 0     Review of Systems  Gastrointestinal: Negative for abdominal pain.  Genitourinary:       Negative for vaginal bleeding   Physical Exam   Blood pressure 119/79, pulse 71, temperature 98.5 F (36.9 C), temperature source Oral, resp. rate 16, last menstrual period 01/29/2016.  Physical Exam  Nursing note and vitals reviewed. Constitutional: She is oriented to person, place, and time. She appears well-developed and well-nourished. No distress.  Tearful after explaining lab results.  Cardiovascular: Normal rate.   Genitourinary:  Not indicated for today's visit  Neurological: She is alert and oriented to person, place, and time.   Results for orders placed or performed during the hospital  encounter of 03/03/16 (from the past 24 hour(s))  hCG, quantitative, pregnancy     Status: Abnormal   Collection Time: 03/03/16 12:17 PM  Result Value Ref Range   hCG, Beta Chain, Quant, S 40 (H) <5 mIU/mL   MAU Course  Procedures  MDM MSE Labs Discussed lab results with Patricia Butler. Assessment and Plan  A.  Hx of cramping in lower pregnancy      BHCG-down from 76 to 40      + Chlamydia screening on 5/3--treated  P:  Return in 48 hrs for repeat BHCG.  Explained the importance of following results to <5.         Patient given time to ask questions.        Return for worsening sxs.  Patricia Butler,Patricia Butler 03/03/2016, 12:59 PM

## 2016-03-03 NOTE — ED Notes (Signed)
She states she is about [redacted] weeks pregnant and went to [facility] for a standard pregnancy follow up appointment and was told her HCG was decreasing. She went home, took a nap and pt states she woke up from the nap today around 1630 with vaginal bleeding, "it feels like Im on my period," no blood clots. She thinks she may be having a possible miscarriage. No hx of such and this is her first pregnancy. Pt tearful at triage.

## 2016-03-03 NOTE — Discharge Instructions (Signed)

## 2016-03-05 ENCOUNTER — Inpatient Hospital Stay (HOSPITAL_COMMUNITY)
Admission: AD | Admit: 2016-03-05 | Discharge: 2016-03-05 | Disposition: A | Discharge status: Home / Self Care | Payer: Medicaid Other | Source: Ambulatory Visit | Attending: Obstetrics & Gynecology | Admitting: Obstetrics & Gynecology

## 2016-03-05 ENCOUNTER — Inpatient Hospital Stay (HOSPITAL_COMMUNITY): Admission: AD | Admit: 2016-03-05 | Payer: Medicaid Other | Admitting: Obstetrics & Gynecology

## 2016-03-05 DIAGNOSIS — Z87891 Personal history of nicotine dependence: Secondary | ICD-10-CM | POA: Insufficient documentation

## 2016-03-05 DIAGNOSIS — O039 Complete or unspecified spontaneous abortion without complication: Secondary | ICD-10-CM

## 2016-03-05 DIAGNOSIS — O209 Hemorrhage in early pregnancy, unspecified: Secondary | ICD-10-CM | POA: Diagnosis present

## 2016-03-05 LAB — HCG, QUANTITATIVE, PREGNANCY: hCG, Beta Chain, Quant, S: 13 m[IU]/mL — ABNORMAL HIGH (ref <5)

## 2016-03-05 NOTE — MAU Note (Signed)
Pt presents to MAU for repeat BHCG. Reports vaginal bleeding with lower abdominal cramping.  

## 2016-03-05 NOTE — MAU Provider Note (Signed)
No chief complaint on file. SUBJECTIVE:  HPI  Patricia Butler is a 20 y.o. G1P0 at [redacted]w[redacted]d presenting for follow-up quantitative beta hCG. She has been bleeding for 3 days as heavy as a period and having lower abdominal cramping. Has passed no clots or obvious tissue. Bleeding is tapering.  Pregnancy is desired. Declines analgesia or prescription for analgesic.  Past Medical History  Diagnosis Date  . Medical history non-contributory   . Chlamydia    Past Surgical History  Procedure Laterality Date  . No past surgeries     Social History   Social History  . Marital Status: Single    Spouse Name: N/A  . Number of Children: N/A  . Years of Education: N/A   Occupational History  . Not on file.   Social History Main Topics  . Smoking status: Former Smoker    Types: Cigars  . Smokeless tobacco: Never Used  . Alcohol Use: No  . Drug Use: No  . Sexual Activity: Yes   Other Topics Concern  . Not on file   Social History Narrative   ROS Pertinent items in history of present illness  OBJECTIVE: Filed Vitals:   03/05/16 1301  BP: 130/89  Pulse: 73  Temp: 97.7 F (36.5 C)   Physical Exam WN WD female in NAD Abdomen soft flat nontender   Results for orders placed or performed during the hospital encounter of 03/05/16 (from the past 24 hour(s))  hCG, quantitative, pregnancy     Status: Abnormal   Collection Time: 03/05/16 11:57 AM  Result Value Ref Range   hCG, Beta Chain, Quant, S 13 (H) <5 mIU/mL   Prior Quantitative beta hCGs 03/01/2016: 76 and 03/03/16: 32  [place] of 03/01/16: CLINICAL DATA: Abdominal and pelvic cramping. Gestational age by a last menstrual period - 4 weeks, 4 days.  EXAM: OBSTETRIC <14 WK [place] AND TRANSVAGINAL OB US  TECHNIQUE: Both transabdominal and transvaginal ultrasound examinations were performed for complete evaluation of the gestation as well as the maternal uterus, adnexal regions, and pelvic cul-de-sac. Transvaginal technique was  performed to assess early pregnancy.  COMPARISON: None.  FINDINGS: Intrauterine gestational sac: Not visualized  Maternal uterus/adnexae: Normal appearance of the anteverted uterus. No discrete uterine mass.  Endometrium is mildly thickened measuring 2 cm in diameter (image 25).  Normal size and appearance of the left ovary measuring 2.7 x 1.9 x 2.1 cm. Several tiny follicles are noted within the periphery of the left ovary. No discrete left ovarian or adnexal mass.  Normal size and appearance of the right ovary, measuring 2.7 x 1.9 x 1.9 cm. Several tiny follicles are noted within the periphery of the right ovary. No discrete right ovarian or adnexal mass.  A trace amount of free fluid in the pelvic cul-de-sac.  IMPRESSION: 1. Non visualization of an intrauterine gestation. Differential considerations include failed intrauterine gestation, early viable though not yet visualized intrauterine gestation and ectopic pregnancy. As such, further evaluation with serial beta hCGs and follow-up pelvic ultrasound is recommended. 2. Otherwise, no explanation for patient's lower abdominal/pelvic cramping.   Electronically Signed  By: Simonne Come M.D.  On: 03/01/2016 18:49  ASSESSMENT/PLAN: 1. SAB (spontaneous abortion)    Discharge with bleeding precautions and F/U quant in [facility]. Return here for worsening symptoms including heavy vaginal bleeding or severe abdominal pain   Medication List    TAKE these medications        azithromycin 500 MG tablet  Commonly known as:  ZITHROMAX  Take  two tablets by mouth once.     metroNIDAZOLE 500 MG tablet  Commonly known as:  FLAGYL  Take 1 tablet (500 mg total) by mouth 2 (two) times daily.       Follow-up Information    Follow up with [facility].   Specialty:  Obstetrics and Gynecology   Why:  Someone from Clinic will call you with appt.   Contact information:   [address] [place] [id number] [phone]

## 2016-03-05 NOTE — Discharge Instructions (Signed)
Miscarriage  A miscarriage is the sudden loss of an unborn baby (fetus) before the 20th week of pregnancy. Most miscarriages happen in the first 3 months of pregnancy. Sometimes, it happens before a woman even knows she is pregnant. A miscarriage is also called a "spontaneous miscarriage" or "early pregnancy loss." Having a miscarriage can be an emotional experience. Talk with your caregiver about any questions you may have about miscarrying, the grieving process, and your future pregnancy plans.  CAUSES    Problems with the fetal chromosomes that make it impossible for the baby to develop normally. Problems with the baby's genes or chromosomes are most often the result of errors that occur, by chance, as the embryo divides and grows. The problems are not inherited from the parents.   Infection of the cervix or uterus.    Hormone problems.    Problems with the cervix, such as having an incompetent cervix. This is when the tissue in the cervix is not strong enough to hold the pregnancy.    Problems with the uterus, such as an abnormally shaped uterus, uterine fibroids, or congenital abnormalities.    Certain medical conditions.    Smoking, drinking alcohol, or taking illegal drugs.    Trauma.   Often, the cause of a miscarriage is unknown.   SYMPTOMS    Vaginal bleeding or spotting, with or without cramps or pain.   Pain or cramping in the abdomen or lower back.   Passing fluid, tissue, or blood clots from the vagina.  DIAGNOSIS   Your caregiver will perform a physical exam. You may also have an ultrasound to confirm the miscarriage. Blood or urine tests may also be ordered.  TREATMENT    Sometimes, treatment is not necessary if you naturally pass all the fetal tissue that was in the uterus. If some of the fetus or placenta remains in the body (incomplete miscarriage), tissue left behind may become infected and must be removed. Usually, a dilation and curettage (D and C) procedure is performed.  During a D and C procedure, the cervix is widened (dilated) and any remaining fetal or placental tissue is gently removed from the uterus.   Antibiotic medicines are prescribed if there is an infection. Other medicines may be given to reduce the size of the uterus (contract) if there is a lot of bleeding.   If you have Rh negative blood and your baby was Rh positive, you will need a Rh immunoglobulin shot. This shot will protect any future baby from having Rh blood problems in future pregnancies.  HOME CARE INSTRUCTIONS    Your caregiver may order bed rest or may allow you to continue light activity. Resume activity as directed by your caregiver.   Have someone help with home and family responsibilities during this time.    Keep track of the number of sanitary pads you use each day and how soaked (saturated) they are. Write down this information.    Do not use tampons. Do not douche or have sexual intercourse until approved by your caregiver.    Only take over-the-counter or prescription medicines for pain or discomfort as directed by your caregiver.    Do not take aspirin. Aspirin can cause bleeding.    Keep all follow-up appointments with your caregiver.    If you or your partner have problems with grieving, talk to your caregiver or seek counseling to help cope with the pregnancy loss. Allow enough time to grieve before trying to get pregnant again.     SEEK IMMEDIATE MEDICAL CARE IF:    You have severe cramps or pain in your back or abdomen.   You have a fever.   You pass large blood clots (walnut-sized or larger) ortissue from your vagina. Save any tissue for your caregiver to inspect.    Your bleeding increases.    You have a thick, bad-smelling vaginal discharge.   You become lightheaded, weak, or you faint.    You have chills.   MAKE SURE YOU:   Understand these instructions.   Will watch your condition.   Will get help right away if you are not doing well or get worse.     This  information is not intended to replace advice given to you by your health care provider. Make sure you discuss any questions you have with your health care provider.     Document Released: 04/11/2001 Document Revised: 02/10/2013 Document Reviewed: 12/05/2011  Elsevier Interactive Patient Education 2016 [employer]

## 2016-03-29 ENCOUNTER — Encounter: Payer: Medicaid Other | Admitting: Obstetrics & Gynecology

## 2016-05-20 ENCOUNTER — Encounter (HOSPITAL_COMMUNITY): Payer: Self-pay | Admitting: Emergency Medicine

## 2016-05-20 ENCOUNTER — Ambulatory Visit (HOSPITAL_COMMUNITY)
Admission: EM | Admit: 2016-05-20 | Discharge: 2016-05-20 | Disposition: A | Discharge status: Home / Self Care | Payer: Medicaid Other | Attending: Family Medicine | Admitting: Family Medicine

## 2016-05-20 DIAGNOSIS — R300 Dysuria: Secondary | ICD-10-CM | POA: Diagnosis not present

## 2016-05-20 LAB — POCT URINALYSIS DIP (DEVICE)
BILIRUBIN URINE: NEGATIVE
GLUCOSE, UA: NEGATIVE mg/dL
HGB URINE DIPSTICK: NEGATIVE
Ketones, ur: NEGATIVE mg/dL
LEUKOCYTES UA: NEGATIVE
NITRITE: NEGATIVE
Protein, ur: NEGATIVE mg/dL
Specific Gravity, Urine: 1.02 (ref 1.005–1.030)
Urobilinogen, UA: 1 mg/dL (ref 0.0–1.0)
pH: 7 (ref 5.0–8.0)

## 2016-05-20 MED ORDER — PHENAZOPYRIDINE HCL 200 MG PO TABS: 200.0000 mg | ORAL_TABLET | Freq: Three times a day (TID) | ORAL | Status: DC | PRN

## 2016-05-20 NOTE — ED Notes (Signed)
Pt reports pain and pressure while urinating since last night. PT has had a UTI before and reports it feels the same.

## 2016-05-20 NOTE — Discharge Instructions (Signed)

## 2016-05-21 NOTE — ED Provider Notes (Signed)
CSN: [id number]     Arrival date & time 05/20/16  1856 History   First MD Initiated Contact with Patient 05/20/16 1956     Chief Complaint  Patient presents with  . Urinary Tract Infection   (Consider location/radiation/quality/duration/timing/severity/associated sxs/prior Treatment) HPI ONSET OF PAINFUL URINATION THIS MORNING. SYMPTOMS ARE BETTER NOW. NO VAGINAL DISCHARGE, HX OF UTI IN THE PAST.  Past Medical History:  Diagnosis Date  . Chlamydia   . Medical history non-contributory    Past Surgical History:  Procedure Laterality Date  . NO PAST SURGERIES     Family History  Problem Relation Age of Onset  . Diabetes Paternal Grandmother    Social History  Substance Use Topics  . Smoking status: Former Smoker    Types: Cigars  . Smokeless tobacco: Never Used  . Alcohol use No   OB History    Gravida Para Term Preterm AB Living   1             SAB TAB Ectopic Multiple Live Births                 Review of Systems  Denies: HEADACHE, NAUSEA, ABDOMINAL PAIN, CHEST PAIN, CONGESTION,  SHORTNESS OF BREATH  Allergies  Review of patient's allergies indicates no known allergies.  Home Medications   Prior to Admission medications   Medication Sig Start Date End Date Taking? Authorizing Provider  azithromycin (ZITHROMAX) 500 MG tablet Take two tablets by mouth once. 03/02/16   Willodean Rosenthal, MD  metroNIDAZOLE (FLAGYL) 500 MG tablet Take 1 tablet (500 mg total) by mouth 2 (two) times daily. 03/01/16   Judeth Horn, NP  phenazopyridine (PYRIDIUM) 200 MG tablet Take 1 tablet (200 mg total) by mouth 3 (three) times daily as needed for pain. 05/20/16   Tharon Aquas, PA   Meds Ordered and Administered this Visit  Medications - No data to display  BP 111/66 (BP Location: Left Arm)   Pulse 62   Temp 99.2 F (37.3 C) (Oral)   Resp 16   Ht 5\' 2"  (1.575 m)   Wt 140 lb (63.5 kg)   LMP 01/29/2016   SpO2 100%   BMI 25.61 kg/m  No data found.   Physical Exam NURSES  NOTES AND VITAL SIGNS REVIEWED. CONSTITUTIONAL: Well developed, well nourished, no acute distress HEENT: normocephalic, atraumatic EYES: Conjunctiva normal NECK:normal ROM, supple, no adenopathy PULMONARY:No respiratory distress, normal effort ABDOMINAL: Soft, ND, NT BS+, No CVAT MUSCULOSKELETAL: Normal ROM of all extremities,  SKIN: warm and dry without rash PSYCHIATRIC: Mood and affect, behavior are normal  Urgent Care Course   Clinical Course    Procedures (including critical care time)  Labs Review Labs Reviewed  POCT URINALYSIS DIP (DEVICE)    Imaging Review No results found.   Visual Acuity Review  Right Eye Distance:   Left Eye Distance:   Bilateral Distance:    Right Eye Near:   Left Eye Near:    Bilateral Near:        RX FOR PYRIDIUM MDM   1. Dysuria     Patient is reassured that there are no issues that require transfer to higher level of care at this time or additional tests. Patient is advised to continue home symptomatic treatment. Patient is advised that if there are new or worsening symptoms to attend the emergency department, contact primary care provider, or return to UC. Instructions of care provided discharged home in stable condition.    THIS NOTE WAS GENERATED  USING A VOICE RECOGNITION SOFTWARE PROGRAM. ALL REASONABLE EFFORTS  WERE MADE TO PROOFREAD THIS DOCUMENT FOR ACCURACY.  I have verbally reviewed the discharge instructions with the patient. A printed AVS was given to the patient.  All questions were answered prior to discharge.      Tharon Aquas, PA 05/21/16 1015

## 2016-06-10 ENCOUNTER — Inpatient Hospital Stay (HOSPITAL_COMMUNITY)
Admission: AD | Admit: 2016-06-10 | Discharge: 2016-06-10 | Disposition: A | Discharge status: Home / Self Care | Payer: Medicaid Other | Source: Ambulatory Visit | Attending: Obstetrics & Gynecology | Admitting: Obstetrics & Gynecology

## 2016-06-10 ENCOUNTER — Encounter (HOSPITAL_COMMUNITY): Payer: Self-pay | Admitting: *Deleted

## 2016-06-10 DIAGNOSIS — O26899 Other specified pregnancy related conditions, unspecified trimester: Secondary | ICD-10-CM

## 2016-06-10 DIAGNOSIS — R110 Nausea: Secondary | ICD-10-CM | POA: Diagnosis not present

## 2016-06-10 DIAGNOSIS — N926 Irregular menstruation, unspecified: Secondary | ICD-10-CM | POA: Diagnosis not present

## 2016-06-10 DIAGNOSIS — O26891 Other specified pregnancy related conditions, first trimester: Secondary | ICD-10-CM | POA: Insufficient documentation

## 2016-06-10 MED ORDER — PROMETHAZINE HCL 12.5 MG PO TABS: 12.5000 mg | ORAL_TABLET | Freq: Four times a day (QID) | ORAL | 0 refills | Status: DC | PRN

## 2016-06-10 NOTE — MAU Note (Signed)
Had +HPT x3, mid July. Had 2 periods after miscarriage.  Just here to see if everything is ok.  No pain or bleeding.  Is having issues with nausea.

## 2016-06-10 NOTE — Discharge Instructions (Signed)
First Trimester of Pregnancy The first trimester of pregnancy is from week 1 until the end of week 12 (months 1 through 3). A week after a sperm fertilizes an egg, the egg will implant on the wall of the uterus. This embryo will begin to develop into a baby. Genes from you and your partner are forming the baby. The female genes determine whether the baby is a boy or a girl. At 6-8 weeks, the eyes and face are formed, and the heartbeat can be seen on ultrasound. At the end of 12 weeks, all the baby's organs are formed.  Now that you are pregnant, you will want to do everything you can to have a healthy baby. Two of the most important things are to get good prenatal care and to follow your health care provider's instructions. Prenatal care is all the medical care you receive before the baby's birth. This care will help prevent, find, and treat any problems during the pregnancy and childbirth. BODY CHANGES Your body goes through many changes during pregnancy. The changes vary from woman to woman.   You may gain or lose a couple of pounds at first.  You may feel sick to your stomach (nauseous) and throw up (vomit). If the vomiting is uncontrollable, call your health care provider.  You may tire easily.  You may develop headaches that can be relieved by medicines approved by your health care provider.  You may urinate more often. Painful urination may mean you have a bladder infection.  You may develop heartburn as a result of your pregnancy.  You may develop constipation because certain hormones are causing the muscles that push waste through your intestines to slow down.  You may develop hemorrhoids or swollen, bulging veins (varicose veins).  Your breasts may begin to grow larger and become tender. Your nipples may stick out more, and the tissue that surrounds them (areola) may become darker.  Your gums may bleed and may be sensitive to brushing and flossing.  Dark spots or blotches (chloasma,  mask of pregnancy) may develop on your face. This will likely fade after the baby is born.  Your menstrual periods will stop.  You may have a loss of appetite.  You may develop cravings for certain kinds of food.  You may have changes in your emotions from day to day, such as being excited to be pregnant or being concerned that something may go wrong with the pregnancy and baby.  You may have more vivid and strange dreams.  You may have changes in your hair. These can include thickening of your hair, rapid growth, and changes in texture. Some women also have hair loss during or after pregnancy, or hair that feels dry or thin. Your hair will most likely return to normal after your baby is born. WHAT TO EXPECT AT YOUR PRENATAL VISITS During a routine prenatal visit:  You will be weighed to make sure you and the baby are growing normally.  Your blood pressure will be taken.  Your abdomen will be measured to track your baby's growth.  The fetal heartbeat will be listened to starting around week 10 or 12 of your pregnancy.  Test results from any previous visits will be discussed. Your health care provider may ask you:  How you are feeling.  If you are feeling the baby move.  If you have had any abnormal symptoms, such as leaking fluid, bleeding, severe headaches, or abdominal cramping.  If you are using any tobacco products,   including cigarettes, chewing tobacco, and electronic cigarettes.  If you have any questions. Other tests that may be performed during your first trimester include:  Blood tests to find your blood type and to check for the presence of any previous infections. They will also be used to check for low iron levels (anemia) and Rh antibodies. Later in the pregnancy, blood tests for diabetes will be done along with other tests if problems develop.  Urine tests to check for infections, diabetes, or protein in the urine.  An ultrasound to confirm the proper growth  and development of the baby.  An amniocentesis to check for possible genetic problems.  Fetal screens for spina bifida and Down syndrome.  You may need other tests to make sure you and the baby are doing well.  HIV (human immunodeficiency virus) testing. Routine prenatal testing includes screening for HIV, unless you choose not to have this test. HOME CARE INSTRUCTIONS  Medicines  Follow your health care provider's instructions regarding medicine use. Specific medicines may be either safe or unsafe to take during pregnancy.  Take your prenatal vitamins as directed.  If you develop constipation, try taking a stool softener if your health care provider approves. Diet  Eat regular, well-balanced meals. Choose a variety of foods, such as meat or vegetable-based protein, fish, milk and low-fat dairy products, vegetables, fruits, and whole grain breads and cereals. Your health care provider will help you determine the amount of weight gain that is right for you.  Avoid raw meat and uncooked cheese. These carry germs that can cause birth defects in the baby.  Eating four or five small meals rather than three large meals a day may help relieve nausea and vomiting. If you start to feel nauseous, eating a few soda crackers can be helpful. Drinking liquids between meals instead of during meals also seems to help nausea and vomiting.  If you develop constipation, eat more high-fiber foods, such as fresh vegetables or fruit and whole grains. Drink enough fluids to keep your urine clear or pale yellow. Activity and Exercise  Exercise only as directed by your health care provider. Exercising will help you:  Control your weight.  Stay in shape.  Be prepared for labor and delivery.  Experiencing pain or cramping in the lower abdomen or low back is a good sign that you should stop exercising. Check with your health care provider before continuing normal exercises.  Try to avoid standing for long  periods of time. Move your legs often if you must stand in one place for a long time.  Avoid heavy lifting.  Wear low-heeled shoes, and practice good posture.  You may continue to have sex unless your health care provider directs you otherwise. Relief of Pain or Discomfort  Wear a good support bra for breast tenderness.   Take warm sitz baths to soothe any pain or discomfort caused by hemorrhoids. Use hemorrhoid cream if your health care provider approves.   Rest with your legs elevated if you have leg cramps or low back pain.  If you develop varicose veins in your legs, wear support hose. Elevate your feet for 15 minutes, 3-4 times a day. Limit salt in your diet. Prenatal Care  Schedule your prenatal visits by the twelfth week of pregnancy. They are usually scheduled monthly at first, then more often in the last 2 months before delivery.  Write down your questions. Take them to your prenatal visits.  Keep all your prenatal visits as directed by your   health care provider. Safety  Wear your seat belt at all times when driving.  Make a list of emergency phone numbers, including numbers for family, friends, the hospital, and police and fire departments. General Tips  Ask your health care provider for a referral to a local prenatal education class. Begin classes no later than at the beginning of month 6 of your pregnancy.  Ask for help if you have counseling or nutritional needs during pregnancy. Your health care provider can offer advice or refer you to specialists for help with various needs.  Do not use hot tubs, steam rooms, or saunas.  Do not douche or use tampons or scented sanitary pads.  Do not cross your legs for long periods of time.  Avoid cat litter boxes and soil used by cats. These carry germs that can cause birth defects in the baby and possibly loss of the fetus by miscarriage or stillbirth.  Avoid all smoking, herbs, alcohol, and medicines not prescribed by  your health care provider. Chemicals in these affect the formation and growth of the baby.  Do not use any tobacco products, including cigarettes, chewing tobacco, and electronic cigarettes. If you need help quitting, ask your health care provider. You may receive counseling support and other resources to help you quit.  Schedule a dentist appointment. At home, brush your teeth with a soft toothbrush and be gentle when you floss. SEEK MEDICAL CARE IF:   You have dizziness.  You have mild pelvic cramps, pelvic pressure, or nagging pain in the abdominal area.  You have persistent nausea, vomiting, or diarrhea.  You have a bad smelling vaginal discharge.  You have pain with urination.  You notice increased swelling in your face, hands, legs, or ankles. SEEK IMMEDIATE MEDICAL CARE IF:   You have a fever.  You are leaking fluid from your vagina.  You have spotting or bleeding from your vagina.  You have severe abdominal cramping or pain.  You have rapid weight gain or loss.  You vomit blood or material that looks like coffee grounds.  You are exposed to German measles and have never had them.  You are exposed to fifth disease or chickenpox.  You develop a severe headache.  You have shortness of breath.  You have any kind of trauma, such as from a fall or a car accident.   This information is not intended to replace advice given to you by your health care provider. Make sure you discuss any questions you have with your health care provider.   Document Released: 10/10/2001 Document Revised: 11/06/2014 Document Reviewed: 08/26/2013 Elsevier Interactive Patient Education 2016 [employer]     Safe Medications in Pregnancy   Acne: Benzoyl Peroxide Salicylic Acid  Backache/Headache: Tylenol: 2 regular strength every 4 hours OR              2 Extra strength every 6 hours  Colds/Coughs/Allergies: Benadryl (alcohol free) 25 mg every 6 hours as needed Breath right  strips Claritin Cepacol throat lozenges Chloraseptic throat spray Cold-Eeze- up to three times per day Cough drops, alcohol free Flonase (by prescription only) Guaifenesin Mucinex Robitussin DM (plain only, alcohol free) Saline nasal spray/drops Sudafed (pseudoephedrine) & Actifed ** use only after [redacted] weeks gestation and if you do not have high blood pressure Tylenol Vicks Vaporub Zinc lozenges Zyrtec   Constipation: Colace Ducolax suppositories Fleet enema Glycerin suppositories Metamucil Milk of magnesia Miralax Senokot Smooth move tea  Diarrhea: Kaopectate Imodium A-D  *NO pepto Bismol    Hemorrhoids: Anusol Anusol HC Preparation H Tucks  Indigestion: Tums Maalox Mylanta Zantac  Pepcid  Insomnia: Benadryl (alcohol free) 25mg every 6 hours as needed Tylenol PM Unisom, no Gelcaps  Leg Cramps: Tums MagGel  Nausea/Vomiting:  Bonine Dramamine Emetrol Ginger extract Sea bands Meclizine  Nausea medication to take during pregnancy:  Unisom (doxylamine succinate 25 mg tablets) Take one tablet daily at bedtime. If symptoms are not adequately controlled, the dose can be increased to a maximum recommended dose of two tablets daily (1/2 tablet in the morning, 1/2 tablet mid-afternoon and one at bedtime). Vitamin B6 100mg tablets. Take one tablet twice a day (up to 200 mg per day).  Skin Rashes: Aveeno products Benadryl cream or 25mg every 6 hours as needed Calamine Lotion 1% cortisone cream  Yeast infection: Gyne-lotrimin 7 Monistat 7  Gum/tooth pain: Anbesol  **If taking multiple medications, please check labels to avoid duplicating the same active ingredients **take medication as directed on the label ** Do not exceed 4000 mg of tylenol in 24 hours **Do not take medications that contain aspirin or ibuprofen     

## 2016-06-10 NOTE — MAU Provider Note (Signed)
S:  Patricia Butler is a 20 y.o. G1P0 at Unknown wks here for confirmation of pregnancy. Patient's last menstrual period was 04/29/2016. Had positive HPT.  Denies any vaginal bleeding or abdominal pain. Reports nausea & vomiting; last vomited this morning. States just want to make sure pregnancy is ok but denies abdominal pain or vaginal bleeding  O:  Past Medical History:  Diagnosis Date  . Chlamydia   . Medical history non-contributory     Family History  Problem Relation Age of Onset  . Diabetes Paternal Grandmother      Vitals:   06/10/16 1140  BP: 105/74  Pulse: 80  Resp: 16  Temp: 99 F (37.2 C)   General:  A&OX3 with no signs of acute distress. She appears well-developed and well-nourished. No distress.  Neck: Normal range of motion.  Pulmonary/Chest: Effort normal. No respiratory distress.  Musculoskeletal: Normal range of motion.  Neurological: She is alert and oriented to person, place, and time.  Skin: Skin is warm and dry.   A: 1. Missed period   2. Pregnancy related nausea, antepartum      P: Explained purpose of MAU Rx phenergan Can f/u with [facility] for pregnancy test if needed for pregnancy verification Discussed reasons to return to MAU  Judeth HornErin Gabrien Mentink, NP

## 2016-07-20 ENCOUNTER — Other Ambulatory Visit (HOSPITAL_COMMUNITY): Payer: Self-pay | Admitting: Nurse Practitioner

## 2016-07-20 ENCOUNTER — Encounter (HOSPITAL_COMMUNITY): Payer: Self-pay | Admitting: Nurse Practitioner

## 2016-07-20 DIAGNOSIS — Z3A12 12 weeks gestation of pregnancy: Secondary | ICD-10-CM

## 2016-07-20 DIAGNOSIS — Z3682 Encounter for antenatal screening for nuchal translucency: Secondary | ICD-10-CM

## 2016-07-20 LAB — OB RESULTS CONSOLE HIV ANTIBODY (ROUTINE TESTING): HIV: NONREACTIVE

## 2016-07-20 LAB — OB RESULTS CONSOLE GC/CHLAMYDIA
CHLAMYDIA, DNA PROBE: NEGATIVE
GC PROBE AMP, GENITAL: NEGATIVE

## 2016-07-20 LAB — OB RESULTS CONSOLE HEPATITIS B SURFACE ANTIGEN: Hepatitis B Surface Ag: NEGATIVE

## 2016-07-20 LAB — OB RESULTS CONSOLE RUBELLA ANTIBODY, IGM: Rubella: IMMUNE

## 2016-07-20 LAB — OB RESULTS CONSOLE RPR: RPR: NONREACTIVE

## 2016-07-27 ENCOUNTER — Encounter (HOSPITAL_COMMUNITY): Payer: Self-pay

## 2016-07-27 ENCOUNTER — Ambulatory Visit (HOSPITAL_COMMUNITY)
Admission: RE | Admit: 2016-07-27 | Discharge: 2016-07-27 | Disposition: A | Discharge status: Home / Self Care | Payer: Medicaid Other | Source: Ambulatory Visit | Attending: Nurse Practitioner | Admitting: Nurse Practitioner

## 2016-07-27 DIAGNOSIS — Z36 Encounter for antenatal screening of mother: Secondary | ICD-10-CM | POA: Insufficient documentation

## 2016-07-27 DIAGNOSIS — Z3682 Encounter for antenatal screening for nuchal translucency: Secondary | ICD-10-CM

## 2016-07-27 DIAGNOSIS — Z3A12 12 weeks gestation of pregnancy: Secondary | ICD-10-CM | POA: Insufficient documentation

## 2016-07-31 ENCOUNTER — Other Ambulatory Visit: Payer: Self-pay | Admitting: Nurse Practitioner

## 2016-08-18 ENCOUNTER — Encounter (HOSPITAL_COMMUNITY): Payer: Self-pay | Admitting: *Deleted

## 2016-08-18 ENCOUNTER — Inpatient Hospital Stay (HOSPITAL_COMMUNITY)
Admission: AD | Admit: 2016-08-18 | Discharge: 2016-08-18 | Disposition: A | Discharge status: Home / Self Care | Payer: Medicaid Other | Source: Ambulatory Visit | Attending: Obstetrics and Gynecology | Admitting: Obstetrics and Gynecology

## 2016-08-18 DIAGNOSIS — R519 Headache, unspecified: Secondary | ICD-10-CM

## 2016-08-18 DIAGNOSIS — Z87891 Personal history of nicotine dependence: Secondary | ICD-10-CM | POA: Diagnosis not present

## 2016-08-18 DIAGNOSIS — R51 Headache: Secondary | ICD-10-CM

## 2016-08-18 DIAGNOSIS — O9989 Other specified diseases and conditions complicating pregnancy, childbirth and the puerperium: Secondary | ICD-10-CM

## 2016-08-18 DIAGNOSIS — O26892 Other specified pregnancy related conditions, second trimester: Secondary | ICD-10-CM | POA: Diagnosis not present

## 2016-08-18 DIAGNOSIS — Z3A16 16 weeks gestation of pregnancy: Secondary | ICD-10-CM | POA: Diagnosis not present

## 2016-08-18 DIAGNOSIS — O219 Vomiting of pregnancy, unspecified: Secondary | ICD-10-CM | POA: Diagnosis not present

## 2016-08-18 DIAGNOSIS — R0981 Nasal congestion: Secondary | ICD-10-CM

## 2016-08-18 DIAGNOSIS — O210 Mild hyperemesis gravidarum: Secondary | ICD-10-CM | POA: Insufficient documentation

## 2016-08-18 LAB — URINALYSIS, ROUTINE W REFLEX MICROSCOPIC
BILIRUBIN URINE: NEGATIVE
Glucose, UA: NEGATIVE mg/dL
Hgb urine dipstick: NEGATIVE
KETONES UR: NEGATIVE mg/dL
LEUKOCYTES UA: NEGATIVE
Nitrite: NEGATIVE
PH: 8 (ref 5.0–8.0)
Protein, ur: NEGATIVE mg/dL
SPECIFIC GRAVITY, URINE: 1.01 (ref 1.005–1.030)

## 2016-08-18 MED ORDER — BUTALBITAL-APAP-CAFFEINE 50-325-40 MG PO TABS
2.0000 | ORAL_TABLET | Freq: Once | ORAL | Status: AC
  Administered 2016-08-18: 2 via ORAL
  Filled 2016-08-18: qty 2

## 2016-08-18 MED ORDER — PSEUDOEPHEDRINE HCL 60 MG PO TABS: 60.0000 mg | ORAL_TABLET | ORAL | 0 refills | Status: DC | PRN

## 2016-08-18 MED ORDER — DOCUSATE SODIUM 100 MG PO CAPS: 100.0000 mg | ORAL_CAPSULE | Freq: Two times a day (BID) | ORAL | 2 refills | Status: DC | PRN

## 2016-08-18 MED ORDER — PROMETHAZINE HCL 25 MG PO TABS: 25.0000 mg | ORAL_TABLET | Freq: Four times a day (QID) | ORAL | 0 refills | Status: DC | PRN

## 2016-08-18 NOTE — MAU Provider Note (Signed)
History     CSN: [id number]  Arrival date and time: 08/18/16 5621   First Provider Initiated Contact with Patient 08/18/16 2043      Chief Complaint  Patient presents with  . Headache  . Emesis During Pregnancy  . Diarrhea   HPI  Patricia Butler is a 20 y.o. G2P0010 at [redacted]w[redacted]d who presents with n/v/d, headache, and congestion. Reports having a cold for the last week. Currently has productive cough, congestion, & headache. Took robitussin today with mild relief of cough but hasn't taken anything for other symptoms. Reports frontal headache that feels like dull pressure. Rates headache 10/10. Lights make pain worse. Denies sore throat, fever/chills, or ear pain.  Woke up today with feeling of abdominal distension. Went to bathroom & had 4 episodes of vomiting and diarrhea. Prior to today had constipation; last BM was at unknown date. Thinks she ate something bad yesterday that caused her GI symptoms today. Denies abdominal pain, vaginal bleeding, or LOF. Does not have antiemetic at home. No longer nauseated.   OB History    Gravida Para Term Preterm AB Living   2       1     SAB TAB Ectopic Multiple Live Births   1              Past Medical History:  Diagnosis Date  . Chlamydia     Past Surgical History:  Procedure Laterality Date  . NO PAST SURGERIES      Family History  Problem Relation Age of Onset  . Diabetes Paternal Grandmother     Social History  Substance Use Topics  . Smoking status: Former Smoker    Types: Cigars  . Smokeless tobacco: Never Used  . Alcohol use No    Allergies: No Known Allergies  Prescriptions Prior to Admission  Medication Sig Dispense Refill Last Dose  . cyclobenzaprine (FLEXERIL) 10 MG tablet take 1 tablet by mouth every 8 hours if needed  0   . folic acid (FOLVITE) 400 MCG tablet Take 400 mcg by mouth daily.   Taking  . Prenatal Vit-Fe Phos-FA-Omega (VITAFOL GUMMIES) 3.33-0.333-34.8 MG CHEW   1   . promethazine (PHENERGAN) 12.5 MG  tablet Take 1 tablet (12.5 mg total) by mouth every 6 (six) hours as needed for nausea or vomiting. (Patient not taking: Reported on 07/27/2016) 30 tablet 0 Not Taking    Review of Systems  Constitutional: Negative.   HENT: Negative for ear pain and sore throat.   Respiratory: Positive for cough and sputum production. Negative for shortness of breath and wheezing.   Cardiovascular: Negative.   Gastrointestinal: Positive for constipation, diarrhea, nausea and vomiting. Negative for abdominal pain.  Genitourinary: Negative.   Neurological: Positive for headaches.   Physical Exam   Blood pressure 115/69, pulse 85, temperature 98.9 F (37.2 C), resp. rate 18, height 5\' 4"  (1.626 m), weight 130 lb 9.6 oz (59.2 kg), last menstrual period 04/28/2016, unknown if currently breastfeeding.  Physical Exam  Nursing note and vitals reviewed. Constitutional: She is oriented to person, place, and time. She appears well-developed and well-nourished. No distress.  HENT:  Head: Normocephalic and atraumatic.  Right Ear: Tympanic membrane normal.  Left Ear: Tympanic membrane normal.  Mouth/Throat: Uvula is midline, oropharynx is clear and moist and mucous membranes are normal.  Eyes: Conjunctivae are normal. Right eye exhibits no discharge. Left eye exhibits no discharge. No scleral icterus.  Neck: Normal range of motion.  Cardiovascular: Normal rate, regular rhythm and  normal heart sounds.   No murmur heard. Respiratory: Effort normal and breath sounds normal. No respiratory distress. She has no wheezes.  GI: Soft. Bowel sounds are normal. There is no tenderness. There is no rebound.  Fundal height 16 cm  Neurological: She is alert and oriented to person, place, and time.  Skin: Skin is warm and dry. She is not diaphoretic.  Psychiatric: She has a normal mood and affect. Her behavior is normal. Judgment and thought content normal.    MAU Course  Procedures Results for orders placed or performed  during the hospital encounter of 08/18/16 (from the past 24 hour(s))  Urinalysis, Routine w reflex microscopic ([facility])     Status: Abnormal   Collection Time: 08/18/16  8:12 PM  Result Value Ref Range   Color, Urine YELLOW YELLOW   APPearance CLOUDY (A) CLEAR   Specific Gravity, Urine 1.010 1.005 - 1.030   pH 8.0 5.0 - 8.0   Glucose, UA NEGATIVE NEGATIVE mg/dL   Hgb urine dipstick NEGATIVE NEGATIVE   Bilirubin Urine NEGATIVE NEGATIVE   Ketones, ur NEGATIVE NEGATIVE mg/dL   Protein, ur NEGATIVE NEGATIVE mg/dL   Nitrite NEGATIVE NEGATIVE   Leukocytes, UA NEGATIVE NEGATIVE    MDM FHT 169 by doppler VSS U/a normal Fioricet 2 tabs PO  Headache resolved after meds Assessment and Plan  A: 1. Acute nonintractable headache, unspecified headache type   2. Head congestion   3. Vomiting pregnancy    P: Discharge home Rx phenergan, sudafed, & colace (to be started if constipated again) OTC meds safe in pregnancy given Keep f/u with OB Discussed reasons to return to MAU  Judeth HornErin Haralambos Yeatts 08/18/2016, 8:43 PM

## 2016-08-18 NOTE — MAU Note (Signed)
Vomiting and diarrhea today. Vomited and had diarrhea about 4 times. Have a headache with head congestion. Denies LOF or bleeding and no vag d/c

## 2016-08-18 NOTE — Discharge Instructions (Signed)
General Headache Without Cause A headache is pain or discomfort felt around the head or neck area. There are many causes and types of headaches. In some cases, the cause may not be found.  HOME CARE  Managing Pain  Take over-the-counter and prescription medicines only as told by your doctor.  Lie down in a dark, quiet room when you have a headache.  If directed, apply ice to the head and neck area:  Put ice in a plastic bag.  Place a towel between your skin and the bag.  Leave the ice on for 20 minutes, 2-3 times per day.  Use a heating pad or hot shower to apply heat to the head and neck area as told by your doctor.  Keep lights dim if bright lights bother you or make your headaches worse. Eating and Drinking  Eat meals on a regular schedule.  Lessen how much alcohol you drink.  Lessen how much caffeine you drink, or stop drinking caffeine. General Instructions  Keep all follow-up visits as told by your doctor. This is important.  Keep a journal to find out if certain things bring on headaches. For example, write down:  What you eat and drink.  How much sleep you get.  Any change to your diet or medicines.  Relax by getting a massage or doing other relaxing activities.  Lessen stress.  Sit up straight. Do not tighten (tense) your muscles.  Do not use tobacco products. This includes cigarettes, chewing tobacco, or e-cigarettes. If you need help quitting, ask your doctor.  Exercise regularly as told by your doctor.  Get enough sleep. This often means 7-9 hours of sleep. GET HELP IF:  Your symptoms are not helped by medicine.  You have a headache that feels different than the other headaches.  You feel sick to your stomach (nauseous) or you throw up (vomit).  You have a fever. GET HELP RIGHT AWAY IF:   Your headache becomes really bad.  You keep throwing up.  You have a stiff neck.  You have trouble seeing.  You have trouble speaking.  You have  pain in the eye or ear.  Your muscles are weak or you lose muscle control.  You lose your balance or have trouble walking.  You feel like you will pass out (faint) or you pass out.  You have confusion.   This information is not intended to replace advice given to you by your health care provider. Make sure you discuss any questions you have with your health care provider.   Document Released: 07/25/2008 Document Revised: 07/07/2015 Document Reviewed: 02/08/2015 Elsevier Interactive Patient Education 2016 [employer].      Food Choices to Help Relieve Diarrhea, Adult When you have diarrhea, the foods you eat and your eating habits are very important. Choosing the right foods and drinks can help relieve diarrhea. Also, because diarrhea can last up to 7 days, you need to replace lost fluids and electrolytes (such as sodium, potassium, and chloride) in order to help prevent dehydration.  WHAT GENERAL GUIDELINES DO I NEED TO FOLLOW?  Slowly drink 1 cup (8 oz) of fluid for each episode of diarrhea. If you are getting enough fluid, your urine will be clear or pale yellow.  Eat starchy foods. Some good choices include white rice, white toast, pasta, low-fiber cereal, baked potatoes (without the skin), saltine crackers, and bagels.  Avoid large servings of any cooked vegetables.  Limit fruit to two servings per day. A serving is  cup or 1 small piece.  Choose foods with less than 2 g of fiber per serving.  Limit fats to less than 8 tsp (38 g) per day.  Avoid fried foods.  Eat foods that have probiotics in them. Probiotics can be found in certain dairy products.  Avoid foods and beverages that may increase the speed at which food moves through the stomach and intestines (gastrointestinal tract). Things to avoid include:  High-fiber foods, such as dried fruit, raw fruits and vegetables, nuts, seeds, and whole grain foods.  Spicy foods and high-fat foods.  Foods and beverages  sweetened with high-fructose corn syrup, honey, or sugar alcohols such as xylitol, sorbitol, and mannitol. WHAT FOODS ARE RECOMMENDED? Grains White rice. White, [place], or pita breads (fresh or toasted), including plain rolls, buns, or bagels. White pasta. Saltine, soda, or graham crackers. Pretzels. Low-fiber cereal. Cooked cereals made with water (such as cornmeal, farina, or cream cereals). Plain muffins. Matzo. Melba toast. Zwieback.  Vegetables Potatoes (without the skin). Strained tomato and vegetable juices. Most well-cooked and canned vegetables without seeds. Tender lettuce. Fruits Cooked or canned applesauce, apricots, cherries, fruit cocktail, grapefruit, peaches, pears, or plums. Fresh bananas, apples without skin, cherries, grapes, cantaloupe, grapefruit, peaches, oranges, or plums.  Meat and Other Protein Products Baked or boiled chicken. Eggs. Tofu. Fish. Seafood. Smooth peanut butter. Ground or well-cooked tender beef, ham, veal, lamb, pork, or poultry.  Dairy Plain yogurt, kefir, and unsweetened liquid yogurt. Lactose-free milk, buttermilk, or soy milk. Plain hard cheese. Beverages Sport drinks. Clear broths. Diluted fruit juices (except prune). Regular, caffeine-free sodas such as ginger ale. Water. Decaffeinated teas. Oral rehydration solutions. Sugar-free beverages not sweetened with sugar alcohols. Other Bouillon, broth, or soups made from recommended foods.  The items listed above may not be a complete list of recommended foods or beverages. Contact your dietitian for more options. WHAT FOODS ARE NOT RECOMMENDED? Grains Whole grain, whole wheat, bran, or rye breads, rolls, pastas, crackers, and cereals. Wild or brown rice. Cereals that contain more than 2 g of fiber per serving. Corn tortillas or taco shells. Cooked or dry oatmeal. Granola. Popcorn. Vegetables Raw vegetables. Cabbage, broccoli, Brussels sprouts, artichokes, baked beans, beet greens, corn, kale, legumes,  peas, sweet potatoes, and yams. Potato skins. Cooked spinach and cabbage. Fruits Dried fruit, including raisins and dates. Raw fruits. Stewed or dried prunes. Fresh apples with skin, apricots, mangoes, pears, raspberries, and strawberries.  Meat and Other Protein Products Chunky peanut butter. Nuts and seeds. Beans and lentils. Tomasa Blase.  Dairy High-fat cheeses. Milk, chocolate milk, and beverages made with milk, such as milk shakes. Cream. Ice cream. Sweets and Desserts Sweet rolls, doughnuts, and sweet breads. Pancakes and waffles. Fats and Oils Butter. Cream sauces. Margarine. Salad oils. Plain salad dressings. Olives. Avocados.  Beverages Caffeinated beverages (such as coffee, tea, soda, or energy drinks). Alcoholic beverages. Fruit juices with pulp. Prune juice. Soft drinks sweetened with high-fructose corn syrup or sugar alcohols. Other Coconut. Hot sauce. Chili powder. Mayonnaise. Gravy. Cream-based or milk-based soups.  The items listed above may not be a complete list of foods and beverages to avoid. Contact your dietitian for more information. WHAT SHOULD I DO IF I BECOME DEHYDRATED? Diarrhea can sometimes lead to dehydration. Signs of dehydration include dark urine and dry mouth and skin. If you think you are dehydrated, you should rehydrate with an oral rehydration solution. These solutions can be purchased at pharmacies, retail stores, or online.  Drink -1 cup (120-240 mL) of oral rehydration  solution each time you have an episode of diarrhea. If drinking this amount makes your diarrhea worse, try drinking smaller amounts more often. For example, drink 1-3 tsp (5-15 mL) every 5-10 minutes.  A general rule for staying hydrated is to drink 1-2 L of fluid per day. Talk to your health care provider about the specific amount you should be drinking each day. Drink enough fluids to keep your urine clear or pale yellow.   This information is not intended to replace advice given to you by  your health care provider. Make sure you discuss any questions you have with your health care provider.   Document Released: 01/06/2004 Document Revised: 11/06/2014 Document Reviewed: 09/08/2013 Elsevier Interactive Patient Education 2016 [employer]      Safe Medications in Pregnancy   Acne: Benzoyl Peroxide Salicylic Acid  Backache/Headache: Tylenol: 2 regular strength every 4 hours OR              2 Extra strength every 6 hours  Colds/Coughs/Allergies: Benadryl (alcohol free) 25 mg every 6 hours as needed Breath right strips Claritin Cepacol throat lozenges Chloraseptic throat spray Cold-Eeze- up to three times per day Cough drops, alcohol free Flonase (by prescription only) Guaifenesin Mucinex Robitussin DM (plain only, alcohol free) Saline nasal spray/drops Sudafed (pseudoephedrine) & Actifed ** use only after [redacted] weeks gestation and if you do not have high blood pressure Tylenol Vicks Vaporub Zinc lozenges Zyrtec   Constipation: Colace Ducolax suppositories Fleet enema Glycerin suppositories Metamucil Milk of magnesia Miralax Senokot Smooth move tea  Diarrhea: Kaopectate Imodium A-D  *NO pepto Bismol  Hemorrhoids: Anusol Anusol HC Preparation H Tucks  Indigestion: Tums Maalox Mylanta Zantac  Pepcid  Insomnia: Benadryl (alcohol free) 25mg  every 6 hours as needed Tylenol PM Unisom, no Gelcaps  Leg Cramps: Tums MagGel  Nausea/Vomiting:  Bonine Dramamine Emetrol Ginger extract Sea bands Meclizine  Nausea medication to take during pregnancy:  Unisom (doxylamine succinate 25 mg tablets) Take one tablet daily at bedtime. If symptoms are not adequately controlled, the dose can be increased to a maximum recommended dose of two tablets daily (1/2 tablet in the morning, 1/2 tablet mid-afternoon and one at bedtime). Vitamin B6 100mg  tablets. Take one tablet twice a day (up to 200 mg per day).  Skin Rashes: Aveeno products Benadryl  cream or 25mg  every 6 hours as needed Calamine Lotion 1% cortisone cream  Yeast infection: Gyne-lotrimin 7 Monistat 7  Gum/tooth pain: Anbesol  **If taking multiple medications, please check labels to avoid duplicating the same active ingredients **take medication as directed on the label ** Do not exceed 4000 mg of tylenol in 24 hours **Do not take medications that contain aspirin or ibuprofen

## 2016-09-20 ENCOUNTER — Emergency Department (HOSPITAL_COMMUNITY)
Admission: EM | Admit: 2016-09-20 | Discharge: 2016-09-21 | Disposition: A | Discharge status: Home / Self Care | Payer: Medicaid Other | Attending: Emergency Medicine | Admitting: Emergency Medicine

## 2016-09-20 ENCOUNTER — Encounter (HOSPITAL_COMMUNITY): Payer: Self-pay | Admitting: *Deleted

## 2016-09-20 DIAGNOSIS — O99512 Diseases of the respiratory system complicating pregnancy, second trimester: Secondary | ICD-10-CM | POA: Insufficient documentation

## 2016-09-20 DIAGNOSIS — J069 Acute upper respiratory infection, unspecified: Secondary | ICD-10-CM

## 2016-09-20 DIAGNOSIS — Z3A20 20 weeks gestation of pregnancy: Secondary | ICD-10-CM | POA: Insufficient documentation

## 2016-09-20 DIAGNOSIS — Z87891 Personal history of nicotine dependence: Secondary | ICD-10-CM | POA: Insufficient documentation

## 2016-09-20 DIAGNOSIS — Z3490 Encounter for supervision of normal pregnancy, unspecified, unspecified trimester: Secondary | ICD-10-CM

## 2016-09-20 NOTE — ED Triage Notes (Signed)
The pt is c/i having a cold cough for one week  She starts coughing and get choked on the sputum and vomits  Pregnant lmp June 3rd  edc April 6th  First baby

## 2016-09-21 ENCOUNTER — Emergency Department (HOSPITAL_COMMUNITY): Payer: Medicaid Other

## 2016-09-21 MED ORDER — GUAIFENESIN 100 MG/5ML PO LIQD: 100.0000 mg | ORAL | 0 refills | Status: DC | PRN

## 2016-09-21 MED ORDER — ONDANSETRON 4 MG PO TBDP: 4.0000 mg | ORAL_TABLET | Freq: Three times a day (TID) | ORAL | 0 refills | Status: DC | PRN

## 2016-09-21 NOTE — ED Provider Notes (Signed)
[facility]-EMERGENCY DEPT Provider Note   CSN: [id number] Arrival date & time: 09/20/16  2212   History   Chief Complaint Chief Complaint  Patient presents with  . Cough    HPI Patricia Butler is a 20 y.o. female.  HPI  PMH of chlamydia. Pt here for evaluation of cough and cold symptoms for 1 week. She reports starting off coughing but then gets shoked of her sputum and then starts to vomit. Her LMP was June 3rd, she is currently 5 months pregnant. She denies having fevers, chills, weight loss, headache, SOB, CP, fatigue, confusion, diaphoresis. Denies vaginal discharge or bleeding.   Past Medical History:  Diagnosis Date  . Chlamydia     There are no active problems to display for this patient.   Past Surgical History:  Procedure Laterality Date  . NO PAST SURGERIES      OB History    Gravida Para Term Preterm AB Living   2       1     SAB TAB Ectopic Multiple Live Births   1               Home Medications    Prior to Admission medications   Medication Sig Start Date End Date Taking? Authorizing Provider  Prenatal Vit-Fe Phos-FA-Omega (VITAFOL GUMMIES) 3.33-0.333-34.8 MG CHEW Chew 3 tablets by mouth daily.  07/21/16  Yes Historical Provider, MD  promethazine (PHENERGAN) 25 MG tablet Take 1 tablet (25 mg total) by mouth every 6 (six) hours as needed for nausea or vomiting. 08/18/16  Yes Judeth HornErin Lawrence, NP  pseudoephedrine (SUDAFED) 60 MG tablet Take 1 tablet (60 mg total) by mouth every 4 (four) hours as needed for congestion. 08/18/16  Yes Judeth HornErin Lawrence, NP    Family History Family History  Problem Relation Age of Onset  . Diabetes Paternal Grandmother     Social History Social History  Substance Use Topics  . Smoking status: Former Smoker    Types: Cigars  . Smokeless tobacco: Never Used  . Alcohol use No     Allergies   Patient has no known allergies.   Review of Systems Review of Systems  Review of Systems All other systems negative except as  documented in the HPI. All pertinent positives and negatives as reviewed in the HPI.  Physical Exam Updated Vital Signs BP 107/76 (BP Location: Right Arm)   Pulse 71   Temp 98.3 F (36.8 C) (Oral)   Resp 16   Ht 5\' 4"  (1.626 m)   Wt 61.2 kg   LMP 04/28/2016   SpO2 100%   BMI 23.17 kg/m   Physical Exam  Constitutional: She appears well-developed and well-nourished. No distress.  HENT:  Head: Normocephalic and atraumatic.  Nose: Right sinus exhibits maxillary sinus tenderness and frontal sinus tenderness. Left sinus exhibits maxillary sinus tenderness and frontal sinus tenderness.  + nasal congestion   Eyes: Conjunctivae are normal. Pupils are equal, round, and reactive to light.  Neck: Trachea normal, normal range of motion and full passive range of motion without pain. Neck supple.  Cardiovascular: Normal rate, regular rhythm and normal pulses.   Pulmonary/Chest: Effort normal and breath sounds normal. She has no decreased breath sounds. She has no wheezes. She has no rhonchi. She has no rales. Chest wall is not dull to percussion. She exhibits no tenderness, no crepitus, no edema, no deformity and no retraction.  Abdominal: Soft. Normal appearance and bowel sounds are normal.  Gravid, non tender abdomen.  Musculoskeletal:  Normal range of motion.  Neurological: She is alert. She has normal strength.  Skin: Skin is warm, dry and intact.  Psychiatric: She has a normal mood and affect. Her speech is normal and behavior is normal. Judgment and thought content normal. Cognition and memory are normal.  Nursing note and vitals reviewed.    ED Treatments / Results  Labs (all labs ordered are listed, but only abnormal results are displayed) Labs Reviewed - No data to display  EKG  EKG Interpretation None       Radiology Dg Chest 2 View  Result Date: 09/21/2016 CLINICAL DATA:  Productive cough with nausea and vomiting for 1 week. Nonsmoker. EXAM: CHEST  2 VIEW COMPARISON:   None. FINDINGS: The heart size and mediastinal contours are within normal limits. Both lungs are clear. The visualized skeletal structures are unremarkable. IMPRESSION: No active cardiopulmonary disease. Electronically Signed   By: Burman NievesWilliam  Stevens M.D.   On: 09/21/2016 00:53    Procedures Procedures (including critical care time)  Medications Ordered in ED Medications - No data to display   Initial Impression / Assessment and Plan / ED Course  I have reviewed the triage vital signs and the nursing notes.  Pertinent labs & imaging results that were available during my care of the patient were reviewed by me and considered in my medical decision making (see chart for details).  Clinical Course     Rx: Guaifenesin and small rx of Zofran, refer back to OB  Final Clinical Impressions(s) / ED Diagnoses   Final diagnoses:  Upper respiratory tract infection, unspecified type  Pregnancy, unspecified gestational age    [place] Pres[place] Prescriptions   No medications on file     Marlon Peliffany Maralyn Witherell, PA-C 09/21/16 0110    Tomasita CrumbleAdeleke Oni, MD 09/21/16 1330

## 2016-10-30 NOTE — L&D Delivery Note (Addendum)
Delivery Note Labored in water.  Pushed well.   At 8:41 AM a viable and healthy female was delivered via Vaginal, Spontaneous Delivery (Presentation: OA ).  APGAR: 8, 9; weight  .  Placenta status: spontaneous and grossly intact with 3 vessel Cord:  with the following complications: .none  Anesthesia:  Local for repair Episiotomy:  none Lacerations: 2nd degree Suture Repair: 3.0 monocryl Est. Blood Loss (mL): 100  Mom to postpartum.  Baby to Couplet care / Skin to Skin.  Patricia Butler is a 21 y.o. female G2P0010 with IUP at [redacted]w[redacted]d admitted for PROM at term.  She progressed with temporary augmentation to complete and pushed well to deliver.  Delivered in the tub, baby brought out of water after delivery to mothers chest.    Cord clamping delayed by several minutes then clamped by CNM and cut by FOB.  Placenta intact and spontaneous, bleeding minimal.  Shallow 2nd laceration repaired without difficulty.  Mom and baby stable prior to transfer to postpartum. She plans on breastfeeding.   Wynelle Bourgeois 02/01/2017, 10:43 AM

## 2017-01-02 ENCOUNTER — Ambulatory Visit (INDEPENDENT_AMBULATORY_CARE_PROVIDER_SITE_OTHER): Payer: Self-pay | Admitting: Pediatrics

## 2017-01-02 DIAGNOSIS — Z3490 Encounter for supervision of normal pregnancy, unspecified, unspecified trimester: Secondary | ICD-10-CM

## 2017-01-02 DIAGNOSIS — Z7681 Expectant parent(s) prebirth pediatrician visit: Secondary | ICD-10-CM

## 2017-01-04 NOTE — Progress Notes (Signed)
Prenatal counseling for impending newborn done--1st child, 35wks currently, no complications, prenatal around 679-12wks 70Z76.81

## 2017-01-10 LAB — OB RESULTS CONSOLE GC/CHLAMYDIA
Chlamydia: NEGATIVE
Gonorrhea: NEGATIVE

## 2017-01-10 LAB — OB RESULTS CONSOLE GBS: STREP GROUP B AG: NEGATIVE

## 2017-01-31 ENCOUNTER — Encounter (HOSPITAL_COMMUNITY): Payer: Self-pay

## 2017-01-31 ENCOUNTER — Inpatient Hospital Stay (HOSPITAL_COMMUNITY)
Admission: AD | Admit: 2017-01-31 | Discharge: 2017-02-02 | DRG: 775 | Disposition: A | Discharge status: Home / Self Care | Payer: Medicaid Other | Source: Ambulatory Visit | Attending: Obstetrics and Gynecology | Admitting: Obstetrics and Gynecology

## 2017-01-31 DIAGNOSIS — D696 Thrombocytopenia, unspecified: Secondary | ICD-10-CM | POA: Diagnosis present

## 2017-01-31 DIAGNOSIS — Z3493 Encounter for supervision of normal pregnancy, unspecified, third trimester: Secondary | ICD-10-CM | POA: Diagnosis present

## 2017-01-31 DIAGNOSIS — O9912 Other diseases of the blood and blood-forming organs and certain disorders involving the immune mechanism complicating childbirth: Secondary | ICD-10-CM | POA: Diagnosis present

## 2017-01-31 DIAGNOSIS — D6959 Other secondary thrombocytopenia: Secondary | ICD-10-CM | POA: Diagnosis present

## 2017-01-31 DIAGNOSIS — Z87891 Personal history of nicotine dependence: Secondary | ICD-10-CM

## 2017-01-31 DIAGNOSIS — Z3A39 39 weeks gestation of pregnancy: Secondary | ICD-10-CM | POA: Diagnosis not present

## 2017-01-31 DIAGNOSIS — Z833 Family history of diabetes mellitus: Secondary | ICD-10-CM | POA: Diagnosis not present

## 2017-01-31 DIAGNOSIS — O4292 Full-term premature rupture of membranes, unspecified as to length of time between rupture and onset of labor: Secondary | ICD-10-CM

## 2017-01-31 DIAGNOSIS — O4202 Full-term premature rupture of membranes, onset of labor within 24 hours of rupture: Secondary | ICD-10-CM | POA: Diagnosis present

## 2017-01-31 DIAGNOSIS — O4290 Premature rupture of membranes, unspecified as to length of time between rupture and onset of labor, unspecified weeks of gestation: Secondary | ICD-10-CM | POA: Diagnosis present

## 2017-01-31 LAB — TYPE AND SCREEN
ABO/RH(D): O POS
Antibody Screen: NEGATIVE

## 2017-01-31 LAB — COMPREHENSIVE METABOLIC PANEL
ALT: 9 U/L — ABNORMAL LOW (ref 14–54)
AST: 17 U/L (ref 15–41)
Albumin: 2.9 g/dL — ABNORMAL LOW (ref 3.5–5.0)
Alkaline Phosphatase: 103 U/L (ref 38–126)
Anion gap: 7 (ref 5–15)
CHLORIDE: 108 mmol/L (ref 101–111)
CO2: 21 mmol/L — ABNORMAL LOW (ref 22–32)
CREATININE: 0.54 mg/dL (ref 0.44–1.00)
Calcium: 8.4 mg/dL — ABNORMAL LOW (ref 8.9–10.3)
Glucose, Bld: 94 mg/dL (ref 65–99)
POTASSIUM: 3.6 mmol/L (ref 3.5–5.1)
Sodium: 136 mmol/L (ref 135–145)
TOTAL PROTEIN: 5.8 g/dL — AB (ref 6.5–8.1)
Total Bilirubin: 0.5 mg/dL (ref 0.3–1.2)

## 2017-01-31 LAB — PROTEIN / CREATININE RATIO, URINE
CREATININE, URINE: 24 mg/dL
Total Protein, Urine: <6 mg/dL

## 2017-01-31 LAB — CBC
HCT: 28.5 % — ABNORMAL LOW (ref 36.0–46.0)
Hemoglobin: 9.2 g/dL — ABNORMAL LOW (ref 12.0–15.0)
MCH: 28.7 pg (ref 26.0–34.0)
MCHC: 32.3 g/dL (ref 30.0–36.0)
MCV: 88.8 fL (ref 78.0–100.0)
Platelets: 72 10*3/uL — ABNORMAL LOW (ref 150–400)
RBC: 3.21 MIL/uL — AB (ref 3.87–5.11)
RDW: 16.3 % — ABNORMAL HIGH (ref 11.5–15.5)
WBC: 5.1 10*3/uL (ref 4.0–10.5)

## 2017-01-31 MED ORDER — LIDOCAINE HCL (PF) 1 % IJ SOLN
30.0000 mL | INTRAMUSCULAR | Status: DC | PRN
  Administered 2017-02-01: 30 mL via SUBCUTANEOUS
  Filled 2017-01-31: qty 30

## 2017-01-31 MED ORDER — OXYCODONE-ACETAMINOPHEN 5-325 MG PO TABS: 1.0000 | ORAL_TABLET | ORAL | Status: DC | PRN

## 2017-01-31 MED ORDER — OXYTOCIN BOLUS FROM INFUSION
500.0000 mL | Freq: Once | INTRAVENOUS | Status: AC
  Administered 2017-02-01: 500 mL via INTRAVENOUS

## 2017-01-31 MED ORDER — LACTATED RINGERS IV SOLN: INTRAVENOUS | Status: DC

## 2017-01-31 MED ORDER — ACETAMINOPHEN 325 MG PO TABS: 650.0000 mg | ORAL_TABLET | ORAL | Status: DC | PRN

## 2017-01-31 MED ORDER — TERBUTALINE SULFATE 1 MG/ML IJ SOLN
0.2500 mg | Freq: Once | INTRAMUSCULAR | Status: DC | PRN
  Filled 2017-01-31: qty 1

## 2017-01-31 MED ORDER — ONDANSETRON HCL 4 MG/2ML IJ SOLN: 4.0000 mg | Freq: Four times a day (QID) | INTRAMUSCULAR | Status: DC | PRN

## 2017-01-31 MED ORDER — LACTATED RINGERS IV SOLN: 500.0000 mL | INTRAVENOUS | Status: DC | PRN

## 2017-01-31 MED ORDER — OXYTOCIN 40 UNITS IN LACTATED RINGERS INFUSION - SIMPLE MED
1.0000 m[IU]/min | INTRAVENOUS | Status: DC
  Administered 2017-01-31: 2 m[IU]/min via INTRAVENOUS
  Administered 2017-02-01: 4 m[IU]/min via INTRAVENOUS
  Filled 2017-01-31: qty 1000

## 2017-01-31 MED ORDER — OXYCODONE-ACETAMINOPHEN 5-325 MG PO TABS: 2.0000 | ORAL_TABLET | ORAL | Status: DC | PRN

## 2017-01-31 MED ORDER — SOD CITRATE-CITRIC ACID 500-334 MG/5ML PO SOLN: 30.0000 mL | ORAL | Status: DC | PRN

## 2017-01-31 MED ORDER — OXYTOCIN 40 UNITS IN LACTATED RINGERS INFUSION - SIMPLE MED
2.5000 [IU]/h | INTRAVENOUS | Status: DC
Start: 2017-01-31 — End: 2017-02-01

## 2017-01-31 NOTE — H&P (Signed)
Patricia Butler is a 21 y.o. female presenting for leaking of fluid since 1030am No labor yet Plans waterbirth, consents on chart . OB History    Gravida Para Term Preterm AB Living   2       1     SAB TAB Ectopic Multiple Live Births   1             Past Medical History:  Diagnosis Date  . Chlamydia    Past Surgical History:  Procedure Laterality Date  . NO PAST SURGERIES     Family History: family history includes Diabetes in her paternal grandmother. Social History:  reports that she has quit smoking. Her smoking use included Cigars. She has never used smokeless tobacco. She reports that she does not drink alcohol or use drugs.     Maternal Diabetes: No Genetic Screening: Normal Maternal Ultrasounds/Referrals: Normal Fetal Ultrasounds or other Referrals:  None Maternal Substance Abuse:  No Significant Maternal Medications:  None Significant Maternal Lab Results:  None  GBS Neg Other Comments:  None  Review of Systems  Constitutional: Negative for chills, fever and malaise/fatigue.  Respiratory: Negative for shortness of breath.   Gastrointestinal: Negative for abdominal pain, constipation, diarrhea, nausea and vomiting.  Genitourinary: Negative for dysuria.       Clear fluid leaking from vagina   Maternal Medical History:  Reason for admission: Rupture of membranes.  Nausea.  Contractions: Onset was 1-2 hours ago.   Frequency: rare.   Perceived severity is mild.    Fetal activity: Perceived fetal activity is normal.   Last perceived fetal movement was within the past hour.    Prenatal complications: No bleeding, pre-eclampsia, preterm labor or substance abuse.   Prenatal Complications - Diabetes: none.    Dilation: 2 Effacement (%): 80 Station: -2 Exam by:: Artelia Laroche CNM Blood pressure 134/90, pulse 99, temperature 99.1 F (37.3 C), temperature source Oral, resp. rate 18, last menstrual period 04/28/2016, unknown if currently breastfeeding. Maternal  Exam:  Uterine Assessment: Contraction strength is mild.  Contraction frequency is rare.   Abdomen: Patient reports no abdominal tenderness. Estimated fetal weight is 7.   Fetal presentation: vertex  Introitus: Normal vulva. Vagina is positive for vaginal discharge (copious clear fluid in vault).  Ferning test: positive.  Nitrazine test: not done. Amniotic fluid character: clear. Copious clear fluid pooling  Pelvis: adequate for delivery.   Cervix: Cervix evaluated by digital exam.     Fetal Exam Fetal Monitor Review: Mode: ultrasound.   Baseline rate: 140.  Variability: moderate (6-25 bpm).   Pattern: accelerations present and no decelerations.    Fetal State Assessment: Category I - tracings are normal.     Physical Exam  Constitutional: She is oriented to person, place, and time. She appears well-developed and well-nourished. No distress.  HENT:  Head: Normocephalic.  Cardiovascular: Normal rate and regular rhythm.   Respiratory: Effort normal. No respiratory distress.  GI: Soft. She exhibits no distension. There is no tenderness. There is no rebound and no guarding.  Genitourinary: Vaginal discharge (copious clear fluid in vault) found.  Musculoskeletal: Normal range of motion.  Neurological: She is alert and oriented to person, place, and time.  Skin: Skin is warm and dry.  Psychiatric: She has a normal mood and affect.    Prenatal labs: ABO, Rh: --/--/O POS (05/03 1815) Antibody:   Rubella:   RPR:    HBsAg:    HIV: Non Reactive (05/03 1815)  GBS:  Assessment/Plan: [place] IUP at [redacted]w[redacted]d PROM at term No labor yet GBS neg  Admit to [facility] suites Plans waterbirth Consents in chart Discussed possible augmentation vs expectant management    Wynelle Bourgeois 01/31/2017, 12:20 PM

## 2017-01-31 NOTE — Anesthesia Pain Management Evaluation Note (Signed)
  [facility] Pain Management Visit Note  Patient: Patricia Butler, 21 y.o., female  "Hello I am a member of the anesthesia team at [facility]. We have an anesthesia team available at all times to provide care throughout the hospital, including epidural management and anesthesia for C-section. I don't know your plan for the delivery whether it a natural birth, water birth, IV sedation, nitrous supplementation, doula or epidural, but we want to meet your pain goals."   1.Was your pain managed to your expectations on prior hospitalizations?   Yes   2.What is your expectation for pain management during this hospitalization?     Water tub  3.How can we help you reach that goal? unsure  Record the patient's initial score and the patient's pain goal.   Pain: 0  Pain Goal: 10 [facility] wants you to be able to say your pain was always managed very well.  Cephus Shelling 01/31/2017

## 2017-01-31 NOTE — MAU Note (Signed)
Pt complaints of LOF since 1030 this am. Clear in color and continues to leak.

## 2017-01-31 NOTE — Progress Notes (Addendum)
Patient ID: Patricia Butler, female   DOB: 1996-01-09, 21 y.o.   MRN: [id number] Doing well Not really feeling cramps  Vitals:   01/31/17 1136 01/31/17 1311  BP: 134/90   Pulse: 99   Resp: 18 18  Temp: 99.1 F (37.3 C) 97.9 F (36.6 C)  TempSrc: Oral Oral  Weight:  165 lb (74.8 kg)  Height:   (1.626 m)   FHR reactive Uterine irritability noted  Above BP noted Now has new thrombocytopenia  Results for orders placed or performed during the hospital encounter of 01/31/17 (from the past 24 hour(s))  CBC     Status: Abnormal   Collection Time: 01/31/17 12:30 PM  Result Value Ref Range   WBC 5.1 4.0 - 10.5 K/uL   RBC 3.21 (L) 3.87 - 5.11 MIL/uL   Hemoglobin 9.2 (L) 12.0 - 15.0 g/dL   HCT 40.9 (L) 81.1 - 91.4 %   MCV 88.8 78.0 - 100.0 fL   MCH 28.7 26.0 - 34.0 pg   MCHC 32.3 30.0 - 36.0 g/dL   RDW 78.2 (H) 95.6 - 21.3 %   Platelets 72 (L) 150 - 400 K/uL  Type and screen [facility]     Status: None   Collection Time: 01/31/17 12:30 PM  Result Value Ref Range   ABO/RH(D) O POS    Antibody Screen NEG    Sample Expiration 02/03/2017    Will cycle BPs to see how they are trending CMET and Pr/Cr ordered

## 2017-02-01 ENCOUNTER — Encounter (HOSPITAL_COMMUNITY): Payer: Self-pay | Admitting: *Deleted

## 2017-02-01 DIAGNOSIS — Z3A39 39 weeks gestation of pregnancy: Secondary | ICD-10-CM

## 2017-02-01 LAB — RPR: RPR: NONREACTIVE

## 2017-02-01 LAB — CBC
HEMATOCRIT: 25.6 % — AB (ref 36.0–46.0)
Hemoglobin: 8.5 g/dL — ABNORMAL LOW (ref 12.0–15.0)
MCH: 29.6 pg (ref 26.0–34.0)
MCHC: 33.2 g/dL (ref 30.0–36.0)
MCV: 89.2 fL (ref 78.0–100.0)
Platelets: 75 10*3/uL — ABNORMAL LOW (ref 150–400)
RBC: 2.87 MIL/uL — AB (ref 3.87–5.11)
RDW: 16.3 % — AB (ref 11.5–15.5)
WBC: 10.4 10*3/uL (ref 4.0–10.5)

## 2017-02-01 MED ORDER — TETANUS-DIPHTH-ACELL PERTUSSIS 5-2.5-18.5 LF-MCG/0.5 IM SUSP: 0.5000 mL | Freq: Once | INTRAMUSCULAR | Status: DC

## 2017-02-01 MED ORDER — SENNOSIDES-DOCUSATE SODIUM 8.6-50 MG PO TABS
2.0000 | ORAL_TABLET | ORAL | Status: DC
  Administered 2017-02-01: 2 via ORAL
  Filled 2017-02-01: qty 2

## 2017-02-01 MED ORDER — DIPHENHYDRAMINE HCL 25 MG PO CAPS: 25.0000 mg | ORAL_CAPSULE | Freq: Four times a day (QID) | ORAL | Status: DC | PRN

## 2017-02-01 MED ORDER — ONDANSETRON HCL 4 MG PO TABS: 4.0000 mg | ORAL_TABLET | ORAL | Status: DC | PRN

## 2017-02-01 MED ORDER — ZOLPIDEM TARTRATE 5 MG PO TABS: 5.0000 mg | ORAL_TABLET | Freq: Every evening | ORAL | Status: DC | PRN

## 2017-02-01 MED ORDER — WITCH HAZEL-GLYCERIN EX PADS: 1.0000 "application " | MEDICATED_PAD | CUTANEOUS | Status: DC | PRN

## 2017-02-01 MED ORDER — COCONUT OIL OIL: 1.0000 "application " | TOPICAL_OIL | Status: DC | PRN

## 2017-02-01 MED ORDER — ONDANSETRON HCL 4 MG/2ML IJ SOLN: 4.0000 mg | INTRAMUSCULAR | Status: DC | PRN

## 2017-02-01 MED ORDER — FENTANYL CITRATE (PF) 100 MCG/2ML IJ SOLN
INTRAMUSCULAR | Status: AC
  Filled 2017-02-01: qty 2

## 2017-02-01 MED ORDER — ACETAMINOPHEN 325 MG PO TABS
650.0000 mg | ORAL_TABLET | ORAL | Status: DC | PRN
  Administered 2017-02-01 – 2017-02-02 (×2): 650 mg via ORAL
  Filled 2017-02-01 (×2): qty 2

## 2017-02-01 MED ORDER — IBUPROFEN 600 MG PO TABS
600.0000 mg | ORAL_TABLET | Freq: Four times a day (QID) | ORAL | Status: DC
  Administered 2017-02-01 (×2): 600 mg via ORAL
  Filled 2017-02-01 (×2): qty 1

## 2017-02-01 MED ORDER — DIBUCAINE 1 % RE OINT: 1.0000 "application " | TOPICAL_OINTMENT | RECTAL | Status: DC | PRN

## 2017-02-01 MED ORDER — BENZOCAINE-MENTHOL 20-0.5 % EX AERO
1.0000 "application " | INHALATION_SPRAY | CUTANEOUS | Status: DC | PRN
  Administered 2017-02-01: 1 via TOPICAL
  Filled 2017-02-01: qty 56

## 2017-02-01 MED ORDER — PRENATAL MULTIVITAMIN CH
1.0000 | ORAL_TABLET | Freq: Every day | ORAL | Status: DC
  Administered 2017-02-01: 1 via ORAL
  Filled 2017-02-01 (×2): qty 1

## 2017-02-01 MED ORDER — SIMETHICONE 80 MG PO CHEW: 80.0000 mg | CHEWABLE_TABLET | ORAL | Status: DC | PRN

## 2017-02-01 MED ORDER — FENTANYL CITRATE (PF) 100 MCG/2ML IJ SOLN
100.0000 ug | INTRAMUSCULAR | Status: DC | PRN
  Administered 2017-02-01: 100 ug via INTRAVENOUS

## 2017-02-01 NOTE — Progress Notes (Signed)
Labor Progress Note Patricia Butler is a 21 y.o. G2P0010 at [redacted]w[redacted]d presented for PROM@1030  am on 01/31/2017. History of gestational thrombocytopenia. Water birth.  S: No complaint. Doesn't feel a lot of contraction. So pitocin was started. Denies headache, vision changes or shortness of breath .  O:  BP (!) 103/59   Pulse 71   Temp 98.1 F (36.7 C) (Oral)   Resp 16   Ht  (1.626 m)   Wt 165 lb (74.8 kg)   LMP 04/28/2016   BMI 28.32 kg/m  EFM: 150/mod var/no decels  CVE: Dilation: 2 Effacement (%): 80 Station: -1 Presentation: Vertex Exam by:: Cletis Media, RN   A&P: 21 y.o. G2P0010 [redacted]w[redacted]d here with PROM at 10:30 am on 01/31/2017. Initial BP elevated. PIH labs negative. Has gestational thrombocytopenia.  #Labor: Latent phase. Water birth. Pitocin started.  #Pain: as needed meds.  #FWB: CAT-1 #GBS: negative  Almon Hercules, MD 12:58 AM

## 2017-02-01 NOTE — Lactation Note (Signed)
This note was copied from a baby's chart. Lactation Consultation Note  Patient Name: Boy Tawania Daponte [id number] Date: 02/01/2017 Reason for consult: Initial assessment   Initial assessment with first time mom of 6 hour old infant . Infant getting his bath currently. Mom reports she has BF x 2 and infant has received formula. Mom report she plans to breast and formula feed. Mom reports some pain when infant latched "when he is sucking hard". Discussed importance of deep latch with feeding and enc mom to call out for assistance with feedings, especially if painful.   Discussed supply and demand, colostrum and milk coming to volume. Enc mom to offer breast STS 8-12 x in 24 hours and to offer breast prior to offering formula to promote milk production. Mom voiced understanding. Enc mom to use pillow and head support with feeding. Feeding log given with instructions for use.   BF Resources handout and LC Brochure given, mom informed of IP/OP Services, BF Support Groups and LC phone #. Enc mom to call [facility] and make appt post d/c. Mom reports she has a Medela pump at home. Mom declined need for assistance at this time. Mom without questions/concerns at this time.    Maternal Data Formula Feeding for Exclusion: Yes Reason for exclusion: Mother's choice to formula and breast feed on admission Does the patient have breastfeeding experience prior to this delivery?: No  Feeding Feeding Type: Formula Nipple Type: Slow - flow Length of feed: 15 min  LATCH Score/Interventions                      Lactation Tools Discussed/Used WIC Program: Yes   Consult Status Consult Status: Follow-up Date: 02/02/17 Follow-up type: In-patient    Silas Flood Jansel Vonstein 02/01/2017, 3:35 PM

## 2017-02-01 NOTE — Progress Notes (Signed)
Per last CBC drawn 01-31-17, platelets 72.  Patient has orders for ibuprofen.  Phoned Dr. Sydnee Cabal to ask if repeat CBC is warranted and/or if ibuprofen should be discontinued.  He stated he would phone RN back after evening check-out with Faculty practice.

## 2017-02-01 NOTE — Progress Notes (Signed)
   Patricia Butler is a 21 y.o. G2P0010 at [redacted]w[redacted]d  admitted for induction of labor due to PROM.  Subjective:  Patient very uncomfortable; desires IV pain medication.  Objective: Vitals:   02/01/17 0200 02/01/17 0300 02/01/17 0339 02/01/17 0340  BP: 108/76 131/85  (!) 148/85  Pulse: 71 63  74  Resp: Temp:   98.4 F (36.9 C)   TempSrc:   Oral   Weight:      Height:       No intake/output data recorded.  FHT:  FHR: 151 bpm, variability: moderate,  accelerations:  Present,  decelerations:  Absent UC:   irregular, every 2-4 minutes SVE:   Dilation: 7.5 Effacement (%): 70 Station: 0 Exam by:: Laural Roes, RN  Labs: Lab Results  Component Value Date   WBC 5.1 01/31/2017   HGB 9.2 (L) 01/31/2017   HCT 28.5 (L) 01/31/2017   MCV 88.8 01/31/2017   PLT 72 (L) 01/31/2017    Assessment / Plan: Augmentation of labor, progressing well  Patient now 7-8 cm, 0 station; IV fentanyl ordered for pain -repeat CBC  Labor: Progressing normally Fetal Wellbeing:  Category I Pain Control:  IV pain meds Anticipated MOD:  NSVD  Charlesetta Garibaldi Zerick Prevette 02/01/2017, 6:49 AM

## 2017-02-01 NOTE — Progress Notes (Signed)
   Patricia Butler is a 21 y.o. G2P0010 at [redacted]w[redacted]d  admitted for induction of labor due to PPROM at 62 on 01-31-2017.  Subjective: Patient unhappy with pitocin; requesting nitrous.   Objective: Vitals:   01/31/17 2330 02/01/17 0001 02/01/17 0031 02/01/17 0339  BP: 121/75 (!) 103/59    Pulse: 74 71    Resp: Temp:    98.4 F (36.9 C)  TempSrc:    Oral  Weight:      Height:       No intake/output data recorded.  FHT:  FHR: 140 bpm, variability: moderate,  accelerations:  Present,  decelerations:  Absent UC:   irregular, every 1-3 minutes SVE:   Dilation: 3.5 Effacement (%): 80 Station: -1 Exam by:: Laural Roes, RN Pitocin @ 3 mu/min  Labs: Lab Results  Component Value Date   WBC 5.1 01/31/2017   HGB 9.2 (L) 01/31/2017   HCT 28.5 (L) 01/31/2017   MCV 88.8 01/31/2017   PLT 72 (L) 01/31/2017    Assessment / Plan: Augmentation of labor, progressing well  Labor: Progressing normally Fetal Wellbeing:  Category I Pain Control:  Nitrous Oxide Anticipated MOD:  NSVD  Patient wants to ambulate without IV and monitors; pitocin turned off and patient free to move about in the room.   Charlesetta Garibaldi Kooistra CNM 02/01/2017, 4:11 AM

## 2017-02-02 LAB — CBC
HCT: 23.3 % — ABNORMAL LOW (ref 36.0–46.0)
Hemoglobin: 8 g/dL — ABNORMAL LOW (ref 12.0–15.0)
MCH: 30.7 pg (ref 26.0–34.0)
MCHC: 34.3 g/dL (ref 30.0–36.0)
MCV: 89.3 fL (ref 78.0–100.0)
PLATELETS: 71 10*3/uL — AB (ref 150–400)
RBC: 2.61 MIL/uL — AB (ref 3.87–5.11)
RDW: 16.4 % — ABNORMAL HIGH (ref 11.5–15.5)
WBC: 8.6 10*3/uL (ref 4.0–10.5)

## 2017-02-02 MED ORDER — ACETAMINOPHEN 325 MG PO TABS: 650.0000 mg | ORAL_TABLET | ORAL | 1 refills | Status: DC | PRN

## 2017-02-02 NOTE — Lactation Note (Signed)
This note was copied from a baby's chart. Lactation Consultation Note  Patient Name: Patricia Butler [id number] Date: 02/02/2017 Reason for consult: Follow-up assessment    With this mom and term baby, now 34 hours old. Mom showed me how to hand express, and she has easily expressed colostrum. Breast care/engorgement reviewed with mom, as well as supply and demand. I reviewed with mom how if she continue to feed formula, she will allow her breasts to get very full/engorged, and will eventually lose her milk supply. I told mom that was fine, if this is what she chooses. Mom advised to stay hydrated, and eat a normal diet. Mom knows to call for questions/concerns.   Maternal Data    Feeding Feeding Type: Breast Fed Nipple Type: Slow - flow  LATCH Score/Interventions Latch: Repeated attempts needed to sustain latch, nipple held in mouth throughout feeding, stimulation needed to elicit sucking reflex.  Audible Swallowing: A few with stimulation  Type of Nipple: Everted at rest and after stimulation  Comfort (Breast/Nipple): Soft / non-tender     Hold (Positioning): Assistance needed to correctly position infant at breast and maintain latch.  LATCH Score: 7  Lactation Tools Discussed/Used     Consult Status Consult Status: Complete Follow-up type: Call as needed    Alfred Levins 02/02/2017, 11:29 AM

## 2017-02-02 NOTE — Discharge Instructions (Signed)
Contraception Choices °Contraception, also called birth control, means things to use or ways to try not to get pregnant. °Hormonal birth control °This kind of birth control uses hormones. Here are some types of hormonal birth control: °· A tube that is put under skin of the arm (implant). The tube can stay in for as long as 3 years. °· Shots to get every 3 months (injections). °· Pills to take every day (birth control pills). °· A patch to change 1 time each week for 3 weeks (birth control patch). After that, the patch is taken off for 1 week. °· A ring to put in the vagina. The ring is left in for 3 weeks. Then it is taken out of the vagina for 1 week. Then a new ring is put in. °· Pills to take after unprotected sex (emergency birth control pills). ° °Barrier birth control °Here are some types of barrier birth control: °· A thin covering that is put on the penis before sex (female condom). The covering is thrown away after sex. °· A soft, loose covering that is put in the vagina before sex (female condom). The covering is thrown away after sex. °· A rubber bowl that sits over the cervix (diaphragm). The bowl must be made for you. The bowl is put into the vagina before sex. The bowl is left in for 6-8 hours after sex. It is taken out within 24 hours. °· A small, soft cup that fits over the cervix (cervical cap). The cup must be made for you. The cup can be left in for 6-8 hours after sex. It is taken out within 48 hours. °· A sponge that is put into the vagina before sex. It must be left in for at least 6 hours after sex. It must be taken out within 30 hours. Then it is thrown away. °· A chemical that kills or stops sperm from getting into the uterus (spermicide). It may be a pill, cream, jelly, or foam to put in the vagina. The chemical should be used at least 10-15 minutes before sex. ° °IUD (intrauterine) birth control °An IUD is a small, T-shaped piece of plastic. It is put inside the uterus. There are two  kinds: °· Hormone IUD. This kind can stay in for 3-5 years. °· Copper IUD. This kind can stay in for 10 years. ° °Permanent birth control °Here are some types of permanent birth control: °· Surgery to block the fallopian tubes. °· Having an insert put into each fallopian tube. °· Surgery to tie off the tubes that carry sperm (vasectomy). ° °Natural planning birth control °Here are some types of natural planning birth control: °· Not having sex on the days the woman could get pregnant. °· Using a calendar: °? To keep track of the length of each period. °? To find out what days pregnancy can happen. °? To plan to not have sex on days when pregnancy can happen. °· Watching for symptoms of ovulation and not having sex during ovulation. One way the woman can check for ovulation is to check her temperature. °· Waiting to have sex until after ovulation. ° °Summary °· Contraception, also called birth control, means things to use or ways to try not to get pregnant. °· Hormonal methods of birth control include implants, injections, pills, patches, vaginal rings, and emergency birth control pills. °· Barrier methods of birth control can include female condoms, female condoms, diaphragms, cervical caps, sponges, and spermicides. °· There are two types of   IUD (intrauterine device) birth control. An IUD can be put in a woman's uterus to prevent pregnancy for 3-5 years. °· Permanent sterilization can be done through a procedure for males, females, or both. °· Natural planning methods involve not having sex on the days when the woman could get pregnant. °This information is not intended to replace advice given to you by your health care provider. Make sure you discuss any questions you have with your health care provider. °Document Released: 08/13/2009 Document Revised: 10/26/2016 Document Reviewed: 10/26/2016 °Elsevier Interactive Patient Education © 2017 [employer] °Home Care Instructions for Mom °ACTIVITY °· Gradually return to  your regular activities. °· Let yourself rest. Nap while your baby sleeps. °· Avoid lifting anything that is heavier than 10 lb (4.5 kg) until your health care provider says it is okay. °· Avoid activities that take a lot of effort and energy (are strenuous) until approved by your health care provider. Walking at a slow-to-moderate pace is usually safe. °· If you had a cesarean delivery: °? Do not vacuum, climb stairs, or drive a car for 4-6 weeks. °? Have someone help you at home until you feel like you can do your usual activities yourself. °? Do exercises as told by your health care provider, if this applies. ° °VAGINAL BLEEDING °You may continue to bleed for 4-6 weeks after delivery. Over time, the amount of blood usually decreases and the color of the blood usually gets lighter. However, the flow of bright red blood may increase if you have been too active. If you need to use more than one pad in an hour because your pad gets soaked, or if you pass a large clot: °· Lie down. °· Raise your feet. °· Place a cold compress on your lower abdomen. °· Rest. °· Call your health care provider. ° °If you are breastfeeding, your period should return anytime between 8 weeks after delivery and the time that you stop breastfeeding. If you are not breastfeeding, your period should return 6-8 weeks after delivery. °PERINEAL CARE °The perineal area, or perineum, is the part of your body between your thighs. After delivery, this area needs special care. Follow these instructions as told by your health care provider. °· Take warm tub baths for 15-20 minutes. °· Use medicated pads and pain-relieving sprays and creams as told. °· Do not use tampons or douches until vaginal bleeding has stopped. °· Each time you go to the bathroom: °? Use a peri bottle. °? Change your pad. °? Use towelettes in place of toilet paper until your stitches have healed. °· Do Kegel exercises every day. Kegel exercises help to maintain the muscles that  support the vagina, bladder, and bowels. You can do these exercises while you are standing, sitting, or lying down. To do Kegel exercises: °? Tighten the muscles of your abdomen and the muscles that surround your birth canal. °? Hold for a few seconds. °? Relax. °? Repeat until you have done this 5 times in a row. °· To prevent hemorrhoids from developing or getting worse: °? Drink enough fluid to keep your urine clear or pale yellow. °? Avoid straining when having a bowel movement. °? Take over-the-counter medicines and stool softeners as told by your health care provider. ° °BREAST CARE °· Wear a tight-fitting bra. °· Avoid taking over-the-counter pain medicine for breast discomfort. °· Apply ice to the breasts to help with discomfort as needed: °? Put ice in a plastic bag. °? Place a towel between your   skin and the bag. °? Leave the ice on for 20 minutes or as told by your health care provider. ° °NUTRITION °· Eat a well-balanced diet. °· Do not try to lose weight quickly by cutting back on calories. °· Take your prenatal vitamins until your postpartum checkup or until your health care provider tells you to stop. ° °POSTPARTUM DEPRESSION °You may find yourself crying for no apparent reason and unable to cope with all of the changes that come with having a newborn. This mood is called postpartum depression. Postpartum depression happens because your hormone levels change after delivery. If you have postpartum depression, get support from your partner, friends, and family. If the depression does not go away on its own after several weeks, contact your health care provider. °BREAST SELF-EXAM °Do a breast self-exam each month, at the same time of the month. If you are breastfeeding, check your breasts just after a feeding, when your breasts are less full. If you are breastfeeding and your period has started, check your breasts on day 5, 6, or 7 of your period. °Report any lumps, bumps, or discharge to your health  care provider. Know that breasts are normally lumpy if you are breastfeeding. This is temporary, and it is not a health risk. °INTIMACY AND SEXUALITY °Avoid sexual activity for at least 3-4 weeks after delivery or until the brownish-red vaginal flow is completely gone. If you want to avoid pregnancy, use some form of birth control. You can get pregnant after delivery, even if you have not had your period. °SEEK MEDICAL CARE IF: °· You feel unable to cope with the changes that a child brings to your life, and these feelings do not go away after several weeks. °· You notice a lump, a bump, or discharge on your breast. ° °SEEK IMMEDIATE MEDICAL CARE IF: °· Blood soaks your pad in 1 hour or less. °· You have: °? Severe pain or cramping in your lower abdomen. °? A bad-smelling vaginal discharge. °? A fever that is not controlled by medicine. °? A fever, and an area of your breast is red and sore. °? Pain or redness in your calf. °? Sudden, severe chest pain. °? Shortness of breath. °? Painful or bloody urination. °? Problems with your vision. °· You vomit for 12 hours or longer. °· You develop a severe headache. °· You have serious thoughts about hurting yourself, your child, or anyone else. ° °This information is not intended to replace advice given to you by your health care provider. Make sure you discuss any questions you have with your health care provider. °Document Released: 10/13/2000 Document Revised: 03/23/2016 Document Reviewed: 04/19/2015 °Elsevier Interactive Patient Education © 2017 [employer] Inc. ° °

## 2017-02-02 NOTE — Discharge Summary (Signed)
OB Discharge Summary  Patient Name: Patricia Butler DOB: 1996/03/14 MRN: [id number]  Date of admission: 01/31/2017 Delivering MD: Aviva Signs   Date of discharge: 02/02/2017  Admitting diagnosis: WATER BROKE Intrauterine pregnancy: [redacted]w[redacted]d     Secondary diagnosis:Active Problems:   PROM (premature rupture of membranes)   Thrombocytopenia (HCC)      Discharge diagnosis: Term Pregnancy Delivered     And gestational thrombocytopenia                                                                 Augmentation: Pitocin  Complications: None  Hospital course:  Onset of Labor With Vaginal Delivery     21 y.o. yo G2P1011 at [redacted]w[redacted]d was admitted in Latent Labor on 01/31/2017. Patient had an uncomplicated labor course as follows:  Membrane Rupture Time/Date: 10:30 AM ,01/31/2017   Intrapartum Procedures: Episiotomy:                                           Lacerations:  2nd degree [3]  Patient had a delivery of a Viable infant. 02/01/2017  Information for the patient's newborn:  Nesta, Scaturro [[id number]]  Delivery Method: Vaginal, Spontaneous Delivery (Filed from Delivery Summary)    Pateint had an uncomplicated postpartum course.  She is ambulating, tolerating a regular diet, passing flatus, and urinating well. Patient is discharged home in stable condition on 02/02/17.   Physical exam  Vitals:   02/01/17 1157 02/01/17 1545 02/01/17 2342 02/02/17 0633  BP: 124/74 117/67 111/74 107/64  Pulse: 89 80 64 61  Resp: Temp: 98.3 F (36.8 C) 98.1 F (36.7 C) 97.7 F (36.5 C) 98 F (36.7 C)  TempSrc: Oral Oral Oral Oral  SpO2: 100% 100%    Weight:      Height:       General: alert. I suggested that she stay until tomorrow but she very much wants to go home. Lochia: appropriate Uterine Fundus: firm and NT at U Incision: N/A DVT Evaluation: No evidence of DVT seen on physical exam. Labs: Lab Results  Component Value Date   WBC 8.6 02/02/2017   HGB 8.0 (L)  02/02/2017   HCT 23.3 (L) 02/02/2017   MCV 89.3 02/02/2017   PLT 71 (L) 02/02/2017   CMP Latest Ref Rng & Units 01/31/2017  Glucose 65 - 99 mg/dL 94  BUN 6 - 20 mg/dL <7(W)  Creatinine 2.95 - 1.00 mg/dL 6.21  Sodium 308 - 657 mmol/L 136  Potassium 3.5 - 5.1 mmol/L 3.6  Chloride 101 - 111 mmol/L 108  CO2 22 - 32 mmol/L 21(L)  Calcium 8.9 - 10.3 mg/dL 8.4(O)  Total Protein 6.5 - 8.1 g/dL 9.6(E)  Total Bilirubin 0.3 - 1.2 mg/dL 0.5  Alkaline Phos 38 - 126 U/L 103  AST 15 - 41 U/L 17  ALT 14 - 54 U/L 9(L)    Discharge instruction: per After Visit Summary and "Baby and Me Booklet".  After Visit Meds:  Allergies as of 02/02/2017   No Known Allergies     Medication List    TAKE these medications   acetaminophen  325 MG tablet Commonly known as:  TYLENOL Take 2 tablets (650 mg total) by mouth every 4 (four) hours as needed (for pain scale < 4).   calcium carbonate 500 MG chewable tablet Commonly known as:  TUMS - dosed in mg elemental calcium Chew 1 tablet by mouth 3 (three) times daily as needed for indigestion or heartburn.   guaiFENesin 100 MG/5ML liquid Commonly known as:  ROBITUSSIN Take 5-10 mLs (100-200 mg total) by mouth every 4 (four) hours as needed for cough.   ondansetron 4 MG disintegrating tablet Commonly known as:  ZOFRAN ODT Take 1 tablet (4 mg total) by mouth every 8 (eight) hours as needed for nausea or vomiting.   prenatal multivitamin Tabs tablet Take 1 tablet by mouth daily at 12 noon.   promethazine 25 MG tablet Commonly known as:  PHENERGAN Take 1 tablet (25 mg total) by mouth every 6 (six) hours as needed for nausea or vomiting.   pseudoephedrine 60 MG tablet Commonly known as:  SUDAFED Take 1 tablet (60 mg total) by mouth every 4 (four) hours as needed for congestion.       Diet: routine diet  Activity: Advance as tolerated. Pelvic rest for 6 weeks.   Outpatient follow up:6 weeks Follow up Appt:No future appointments. Follow up visit: No  Follow-up on file.  Postpartum contraception: Abstinence  Newborn Data: Live born female  Birth Weight: 7 lb 13 oz (3544 g) APGAR: 8, 9  Baby Feeding: Breast Disposition:home with mother pending peds rounding and approval   02/02/2017 Allie Bossier, MD

## 2017-06-01 NOTE — Telephone Encounter (Signed)
See note

## 2017-07-30 ENCOUNTER — Emergency Department (HOSPITAL_COMMUNITY): Admission: EM | Admit: 2017-07-30 | Discharge: 2017-07-30 | Discharge status: Against Medical Advice | Payer: Self-pay

## 2017-07-30 NOTE — ED Notes (Signed)
Pt called for triage x3 with no answer

## 2017-07-31 ENCOUNTER — Emergency Department (HOSPITAL_COMMUNITY)
Admission: EM | Admit: 2017-07-31 | Discharge: 2017-07-31 | Disposition: A | Discharge status: Home / Self Care | Payer: Medicaid Other | Attending: Emergency Medicine | Admitting: Emergency Medicine

## 2017-07-31 ENCOUNTER — Encounter (HOSPITAL_COMMUNITY): Payer: Self-pay | Admitting: Nurse Practitioner

## 2017-07-31 DIAGNOSIS — R519 Headache, unspecified: Secondary | ICD-10-CM

## 2017-07-31 DIAGNOSIS — Z79899 Other long term (current) drug therapy: Secondary | ICD-10-CM | POA: Diagnosis not present

## 2017-07-31 DIAGNOSIS — B9789 Other viral agents as the cause of diseases classified elsewhere: Secondary | ICD-10-CM | POA: Diagnosis not present

## 2017-07-31 DIAGNOSIS — J069 Acute upper respiratory infection, unspecified: Secondary | ICD-10-CM

## 2017-07-31 DIAGNOSIS — R509 Fever, unspecified: Secondary | ICD-10-CM | POA: Diagnosis present

## 2017-07-31 DIAGNOSIS — R51 Headache: Secondary | ICD-10-CM | POA: Diagnosis not present

## 2017-07-31 DIAGNOSIS — Z87891 Personal history of nicotine dependence: Secondary | ICD-10-CM | POA: Insufficient documentation

## 2017-07-31 HISTORY — DX: Migraine, unspecified, not intractable, without status migrainosus: G43.909

## 2017-07-31 LAB — I-STAT BETA HCG BLOOD, ED (MC, WL, AP ONLY)

## 2017-07-31 MED ORDER — KETOROLAC TROMETHAMINE 30 MG/ML IJ SOLN
15.0000 mg | Freq: Once | INTRAMUSCULAR | Status: DC
  Filled 2017-07-31: qty 1

## 2017-07-31 MED ORDER — AMOXICILLIN 500 MG PO CAPS: 1000.0000 mg | ORAL_CAPSULE | Freq: Two times a day (BID) | ORAL | 0 refills | Status: DC

## 2017-07-31 MED ORDER — SODIUM CHLORIDE 0.9 % IV BOLUS (SEPSIS)
1000.0000 mL | Freq: Once | INTRAVENOUS | Status: AC
  Administered 2017-07-31: 1000 mL via INTRAVENOUS

## 2017-07-31 MED ORDER — PROCHLORPERAZINE EDISYLATE 5 MG/ML IJ SOLN
10.0000 mg | Freq: Once | INTRAMUSCULAR | Status: AC
  Administered 2017-07-31: 10 mg via INTRAVENOUS
  Filled 2017-07-31: qty 2

## 2017-07-31 MED ORDER — BUTALBITAL-APAP-CAFFEINE 50-325-40 MG PO TABS: 1.0000 | ORAL_TABLET | Freq: Four times a day (QID) | ORAL | 0 refills | Status: DC | PRN

## 2017-07-31 MED ORDER — DIPHENHYDRAMINE HCL 50 MG/ML IJ SOLN
12.5000 mg | Freq: Once | INTRAMUSCULAR | Status: AC
  Administered 2017-07-31: 12.5 mg via INTRAVENOUS
  Filled 2017-07-31: qty 1

## 2017-07-31 MED ORDER — FLUTICASONE PROPIONATE 50 MCG/ACT NA SUSP
2.0000 | Freq: Once | NASAL | Status: DC
  Filled 2017-07-31: qty 16

## 2017-07-31 MED ORDER — DEXAMETHASONE SODIUM PHOSPHATE 10 MG/ML IJ SOLN
10.0000 mg | Freq: Once | INTRAMUSCULAR | Status: AC
  Administered 2017-07-31: 10 mg via INTRAVENOUS
  Filled 2017-07-31: qty 1

## 2017-07-31 NOTE — ED Notes (Signed)
Patient denies pain and is resting comfortably.  

## 2017-07-31 NOTE — ED Provider Notes (Signed)
[facility]EMER[facility]ENCY [facility] Provider Note   CSN: [id number] Arrival date & time: 07/31/17  0906     History   Chief Complaint Chief Complaint  Patient presents with  . URI  . Migraine    HPI   Blood pressure 123/81, pulse 86, temperature 98.4 F (36.9 C), temperature source Oral, resp. rate 18, height  (1.6 m), weight 72.6 kg (160 lb), last menstrual period 07/26/2017, SpO2 100 %, unknown if currently breastfeeding.  Patricia Butler is a 21 y.o. female complaining of rhinorrhea, congestion and fever with MAXIMUM TEMPERATURE 101 yesterday. She denies chest pain, shortness of breath, nausea or vomiting. She states respiratory infection is exacerbating her chronic migraines. Migraines typical prior. She denies any neck stiffness, rash, exacerbation with exertion or Valsalva.  Past Medical History:  Diagnosis Date  . Chlamydia   . Migraine     Patient Active Problem List   Diagnosis Date Noted  . PROM (premature rupture of membranes) 01/31/2017  . Thrombocytopenia (HCC) 01/31/2017    Past Surgical History:  Procedure Laterality Date  . NO PAST SURGERIES    . WISDOM TOOTH EXTRACTION      OB History    Gravida Para Term Preterm AB Living   SAB TAB Ectopic Multiple Live Births   1     0 1       Home Medications    Prior to Admission medications   Medication Sig Start Date End Date Taking? Authorizing Provider  aspirin-acetaminophen-caffeine (EXCEDRIN MIGRAINE) [phone] MG tablet Take 2 tablets by mouth every 6 (six) hours as needed for headache.   Yes [provider]  naproxen sodium (ANAPROX) 220 MG tablet Take 440 mg by mouth 2 (two) times daily with a meal.   Yes [provider]  amoxicillin (AMOXIL) 500 MG capsule Take 2 capsules (1,000 mg total) by mouth 2 (two) times daily. 07/31/17   Eashan Schipani, Joni Reining, PA-C  butalbital-acetaminophen-caffeine (FIORICET, ESGIC) 50-325-40 MG tablet Take 1 tablet by mouth every 6 (six) hours as needed  for headache. 07/31/17   Jentzen Minasyan, Joni Reining, PA-C    Family History Family History  Problem Relation Age of Onset  . Diabetes Paternal Grandmother     Social History Social History  Substance Use Topics  . Smoking status: Former Smoker    Types: Cigars  . Smokeless tobacco: Never Used  . Alcohol use No     Allergies   Patient has no known allergies.   Review of Systems Review of Systems  A complete review of systems was obtained and all systems are negative except as noted in the HPI and PMH.    Physical Exam Updated Vital Signs BP 123/81   Pulse 86   Temp 98.4 F (36.9 C) (Oral)   Resp 18   Ht  (1.6 m)   Wt 72.6 kg (160 lb)   LMP 07/26/2017   SpO2 100%   BMI 28.34 kg/m   Physical Exam  Constitutional: She is oriented to person, place, and time. She appears well-developed and well-nourished.  HENT:  Head: Normocephalic and atraumatic.  Right Ear: External ear normal.  Left Ear: External ear normal.  Mouth/Throat: Oropharynx is clear and moist. No oropharyngeal exudate.  No drooling or stridor. Posterior pharynx mildly erythematous no significant tonsillar hypertrophy. No exudate. Soft palate rises symmetrically. No TTP or induration under tongue.   No tenderness to palpation of frontal or bilateral maxillary sinuses.  Mild mucosal edema in  the nares with scant rhinorrhea.  Bilateral tympanic membranes with normal architecture and good light reflex.    Eyes: Pupils are equal, round, and reactive to light. Conjunctivae and EOM are normal.  No TTP of maxillary or frontal sinuses  No TTP or induration of temporal arteries bilaterally  Neck: Normal range of motion. Neck supple.  FROM to C-spine. Pt can touch chin to chest without discomfort. No TTP of midline cervical spine.   Cardiovascular: Normal rate, regular rhythm and intact distal pulses.   Pulmonary/Chest: Effort normal and breath sounds normal. No stridor. No respiratory distress. She has no  wheezes. She has no rales. She exhibits no tenderness.  Abdominal: Soft. Bowel sounds are normal. There is no tenderness. There is no rebound and no guarding.  Musculoskeletal: Normal range of motion. She exhibits no edema or tenderness.  Neurological: She is alert and oriented to person, place, and time. No cranial nerve deficit.  II-Visual fields grossly intact. III/IV/VI-Extraocular movements intact.  Pupils reactive bilaterally. V/VII-Smile symmetric, equal eyebrow raise,  facial sensation intact VIII- Hearing grossly intact IX/X-Normal gag XI-bilateral shoulder shrug XII-midline tongue extension Motor: 5/5 bilaterally with normal tone and bulk Cerebellar: Normal finger-to-nose  and normal heel-to-shin test.   Romberg negative Ambulates with a coordinated gait   Nursing note and vitals reviewed.    ED Treatments / Results  Labs (all labs ordered are listed, but only abnormal results are displayed) Labs Reviewed  I-STAT BETA HCG BLOOD, ED ([facility], [facility], [facility] ONLY)    EKG  EKG Interpretation None       Radiology No results found.  Procedures Procedures (including critical care time)  Medications Ordered in ED Medications  fluticasone (FLONASE) 50 MCG/ACT nasal spray 2 spray (not administered)  ketorolac (TORADOL) 30 MG/ML injection 15 mg (not administered)  prochlorperazine (COMPAZINE) injection 10 mg (10 mg Intravenous Given 07/31/17 0954)  dexamethasone (DECADRON) injection 10 mg (10 mg Intravenous Given 07/31/17 0952)  diphenhydrAMINE (BENADRYL) injection 12.5 mg (12.5 mg Intravenous Given 07/31/17 1001)  sodium chloride 0.9 % bolus 1,000 mL (0 mLs Intravenous Stopped 07/31/17 1054)     Initial Impression / Assessment and Plan / ED Course  I have reviewed the triage vital signs and the nursing notes.  Pertinent labs & imaging results that were available during my care of the patient were reviewed by me and considered in my medical decision making (see chart for  details).     Vitals:   07/31/17 0914 07/31/17 0918 07/31/17 0924  BP:  129/74 123/81  Pulse:  98 86  Resp:  18   Temp:  98.5 F (36.9 C) 98.4 F (36.9 C)  TempSrc:  Oral Oral  SpO2:  99% 100%  Weight: 72.6 kg (160 lb)    Height:  (1.6 m)      Medications  fluticasone (FLONASE) 50 MCG/ACT nasal spray 2 spray (not administered)  ketorolac (TORADOL) 30 MG/ML injection 15 mg (not administered)  prochlorperazine (COMPAZINE) injection 10 mg (10 mg Intravenous Given 07/31/17 0954)  dexamethasone (DECADRON) injection 10 mg (10 mg Intravenous Given 07/31/17 0952)  diphenhydrAMINE (BENADRYL) injection 12.5 mg (12.5 mg Intravenous Given 07/31/17 1001)  sodium chloride 0.9 % bolus 1,000 mL (0 mLs Intravenous Stopped 07/31/17 1054)    Patricia Butler is 21 y.o. female presenting with Upper respiratory infection with sinus pressure and pain she states that this is exacerbating her chronic migraine, nonfocal neurologic exam, no meningismus. Patient nontoxic appearing. Likely viral URI exacerbating chronic underlying migraine. Will  give headache cocktail, I don't believe this is a bacterial sinusitis that needs antibiotics however will write patient a prescription because she has no outpatient care.  Headache improved with headache cocktail. Outpatient referral to [facility] neurology for chronic migraines.  Evaluation does not show pathology that would require ongoing emergent intervention or inpatient treatment. Pt is hemodynamically stable and mentating appropriately. Discussed findings and plan with patient/guardian, who agrees with care plan. All questions answered. Return precautions discussed and outpatient follow up given.      Final Clinical Impressions(s) / ED Diagnoses   Final diagnoses:  Viral upper respiratory tract infection  Bad headache    New Prescriptions New Prescriptions   AMOXICILLIN (AMOXIL) 500 MG CAPSULE    Take 2 capsules (1,000 mg total) by mouth 2 (two) times  daily.   BUTALBITAL-ACETAMINOPHEN-CAFFEINE (FIORICET, ESGIC) 50-325-40 MG TABLET    Take 1 tablet by mouth every 6 (six) hours as needed for headache.     Wynetta Emery, Cordelia Poche 07/31/17 1113    Tegeler, Canary Brim, MD 07/31/17 [phone]

## 2017-07-31 NOTE — ED Triage Notes (Signed)
Patient states she started having cold symptoms about 3 days ago, She reports taking Dayquil, Nyquil, and Vicks vapor rub. Reports the congestion and drainage is now causing her to have migraines which she has a history of. She reports taking Excedrin migraine without relief. She has not seen a provider in years for her migraines but even outside of the URI she has been having them again with life sensitivity and some nausea as well. She denies fevers, nausea, vomiting, or diarrhea. Some nausea at times however with the migraines. She is A&O x4 and ambulatory.

## 2017-07-31 NOTE — ED Notes (Signed)
ED Provider at bedside. 

## 2017-07-31 NOTE — Discharge Instructions (Signed)
You can use Flonase nasal spray which is avilable over the counter. Use nasal saline (you can try Arm and Hammer Simply Saline) at least 4 times a day, use saline 5-10 minutes before using the fluticasone (flonase) nasal spray  Do not use Afrin (Oxymetazoline)  Rest, wash hands frequently  and drink plenty of water.  You may try over the counter medication such as allegra-decongestant or claritin-decongestant and/or Mucinex or decongestants.  Push fluids to try to thin the mucus and get it to flow out the nose.   Rest, stay well-hydrated and you may also consider taking vitamin C. Do not sniffle the nose, blow forward whenever you feel drainage. If you are not feeling better in 3-5 days or if your symptoms worsen with fever, cough or shortness of breath - start antibiotics at that time. DO NOT HESITATE to return to the emergency department for concerning symptoms.   Do not hesitate to return to the emergency room for any new, worsening or concerning symptoms.  Please obtain primary care using resource guide below. Let them know that you were seen in the emergency room and that they will need to obtain records for further outpatient management.

## 2017-09-24 ENCOUNTER — Ambulatory Visit: Payer: Self-pay | Admitting: Neurology

## 2017-09-25 ENCOUNTER — Encounter: Payer: Self-pay | Admitting: Neurology

## 2017-11-20 ENCOUNTER — Other Ambulatory Visit: Payer: Self-pay

## 2017-11-20 ENCOUNTER — Ambulatory Visit (HOSPITAL_COMMUNITY)
Admission: EM | Admit: 2017-11-20 | Discharge: 2017-11-20 | Disposition: A | Discharge status: Home / Self Care | Payer: Medicaid Other | Attending: Family Medicine | Admitting: Family Medicine

## 2017-11-20 ENCOUNTER — Encounter (HOSPITAL_COMMUNITY): Payer: Self-pay | Admitting: Emergency Medicine

## 2017-11-20 DIAGNOSIS — N898 Other specified noninflammatory disorders of vagina: Secondary | ICD-10-CM | POA: Diagnosis not present

## 2017-11-20 DIAGNOSIS — J029 Acute pharyngitis, unspecified: Secondary | ICD-10-CM | POA: Diagnosis present

## 2017-11-20 DIAGNOSIS — Z87891 Personal history of nicotine dependence: Secondary | ICD-10-CM | POA: Insufficient documentation

## 2017-11-20 DIAGNOSIS — Z79899 Other long term (current) drug therapy: Secondary | ICD-10-CM | POA: Insufficient documentation

## 2017-11-20 DIAGNOSIS — J069 Acute upper respiratory infection, unspecified: Secondary | ICD-10-CM | POA: Insufficient documentation

## 2017-11-20 DIAGNOSIS — Z7982 Long term (current) use of aspirin: Secondary | ICD-10-CM | POA: Diagnosis not present

## 2017-11-20 DIAGNOSIS — K5901 Slow transit constipation: Secondary | ICD-10-CM | POA: Insufficient documentation

## 2017-11-20 DIAGNOSIS — D696 Thrombocytopenia, unspecified: Secondary | ICD-10-CM | POA: Insufficient documentation

## 2017-11-20 DIAGNOSIS — N939 Abnormal uterine and vaginal bleeding, unspecified: Secondary | ICD-10-CM | POA: Diagnosis not present

## 2017-11-20 DIAGNOSIS — R102 Pelvic and perineal pain: Secondary | ICD-10-CM | POA: Diagnosis not present

## 2017-11-20 LAB — POCT URINALYSIS DIP (DEVICE)
BILIRUBIN URINE: NEGATIVE
Glucose, UA: NEGATIVE mg/dL
HGB URINE DIPSTICK: NEGATIVE
KETONES UR: NEGATIVE mg/dL
Leukocytes, UA: NEGATIVE
Nitrite: POSITIVE — AB
PH: 6 (ref 5.0–8.0)
PROTEIN: NEGATIVE mg/dL
SPECIFIC GRAVITY, URINE: 1.025 (ref 1.005–1.030)
Urobilinogen, UA: 1 mg/dL (ref 0.0–1.0)

## 2017-11-20 LAB — POCT PREGNANCY, URINE: Preg Test, Ur: NEGATIVE

## 2017-11-20 MED ORDER — METRONIDAZOLE 500 MG PO TABS: 500.0000 mg | ORAL_TABLET | Freq: Two times a day (BID) | ORAL | 0 refills | Status: AC

## 2017-11-20 MED ORDER — POLYETHYLENE GLYCOL 3350 17 GM/SCOOP PO POWD: 17.0000 g | Freq: Every day | ORAL | 0 refills | Status: DC | PRN

## 2017-11-20 MED ORDER — CROMOLYN SODIUM 5.2 MG/ACT NA AERS: 1.0000 | INHALATION_SPRAY | Freq: Four times a day (QID) | NASAL | 12 refills | Status: DC

## 2017-11-20 NOTE — ED Triage Notes (Signed)
Pt c/o sore throat since yesterday, pt also c/o pressure in her face. Pt also states "I need a pelvic exam" pt states since shes given birth and on birth control her period has been very irregular.

## 2017-11-20 NOTE — ED Provider Notes (Signed)
[facility]    CSN: [id number] Arrival date & time: 11/20/17  1007     History   Chief Complaint Chief Complaint  Patient presents with  . Sore Throat  . Vaginal Bleeding    HPI Patricia Butler is a 22 y.o. female.   Patricia Butler presents with complaints of sore throat, runny nose, slight cough and body aches which started yesterday. 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 No known fevers. Ears popping but without pain. Without nausea or vomiting. Denies shortness of breath. Has not taken any medication for her symptoms. Denies urinary symptoms.  Also complains pelvic pain which started yesterday and has improved. States she has been constipated, last BM two days ago and only passing small hard stool. She states she has noted increased vaginal discharge with slight odor. It is white and milky. Denies vaginal itching. LMP 1/4. She took a pregnancy test this morning which was negative. She had a small amoutn of spotting last week which she attributed to ovulation. Without currently bleeding. She is sexually active with 1 partner does not use condoms denies concern for STDS.     ROS per HPI.       Past Medical History:  Diagnosis Date  . Chlamydia   . Migraine     Patient Active Problem List   Diagnosis Date Noted  . PROM (premature rupture of membranes) 01/31/2017  . Thrombocytopenia (HCC) 01/31/2017    Past Surgical History:  Procedure Laterality Date  . NO PAST SURGERIES    . WISDOM TOOTH EXTRACTION      OB History    Gravida Para Term Preterm AB Living   2 1 1   1 1    SAB TAB Ectopic Multiple Live Births   1     0 1       Home Medications    Prior to Admission medications   Medication Sig Start Date End Date Taking? Authorizing Provider  aspirin-acetaminophen-caffeine (EXCEDRIN MIGRAINE) [phone] MG tablet Take 2 tablets by mouth every 6 (six) hours as needed for headache.    [provider]    butalbital-acetaminophen-caffeine (FIORICET, ESGIC) 50-325-40 MG tablet Take 1 tablet by mouth every 6 (six) hours as needed for headache. 07/31/17   Pisciotta, Joni Reining, PA-C  cromolyn (NASALCROM) 5.2 MG/ACT nasal spray Place 1 spray into both nostrils 4 (four) times daily. 11/20/17   Georgetta Haber, NP  metroNIDAZOLE (FLAGYL) 500 MG tablet Take 1 tablet (500 mg total) by mouth 2 (two) times daily for 7 days. 11/20/17 11/27/17  Georgetta Haber, NP  naproxen sodium (ANAPROX) 220 MG tablet Take 440 mg by mouth 2 (two) times daily with a meal.    [provider]  polyethylene glycol powder (MIRALAX) powder Take 17 g by mouth daily as needed. 11/20/17   Georgetta Haber, NP    Family History Family History  Problem Relation Age of Onset  . Diabetes Paternal Grandmother     Social History Social History   Tobacco Use  . Smoking status: Former Smoker    Types: Cigars  . Smokeless tobacco: Never Used  Substance Use Topics  . Alcohol use: No  . Drug use: No     Allergies   Patient has no known allergies.   Review of Systems Review of Systems   Physical Exam Triage Vital Signs ED Triage Vitals  Enc Vitals Group     BP 11/20/17 1035 128/84  Pulse Rate 11/20/17 1035 (!) 104     Resp 11/20/17 1035 20     Temp 11/20/17 1035 98 F (36.7 C)     Temp Source 11/20/17 1035 Oral     SpO2 11/20/17 1035 100 %     Weight --      Height --      Head Circumference --      Peak Flow --      Pain Score 11/20/17 1041 0     Pain Loc --      Pain Edu? --      Excl. in GC? --    No data found.  Updated Vital Signs BP 128/84 (BP Location: Right Arm)   Pulse (!) 104 Comment: Notified Ramon  Temp 98 F (36.7 C) (Oral)   Resp 20   SpO2 100%   Visual Acuity Right Eye Distance:   Left Eye Distance:   Bilateral Distance:    Right Eye Near:   Left Eye Near:    Bilateral Near:     Physical Exam  Constitutional: She is oriented to person, place, and time. She appears  well-developed and well-nourished. No distress.  HENT:  Head: Normocephalic and atraumatic.  Right Ear: Tympanic membrane, external ear and ear canal normal.  Left Ear: Tympanic membrane, external ear and ear canal normal.  Nose: Nose normal.  Mouth/Throat: Uvula is midline and mucous membranes are normal. Posterior oropharyngeal erythema present. No tonsillar exudate.  Eyes: Conjunctivae and EOM are normal. Pupils are equal, round, and reactive to light.  Cardiovascular: Normal rate, regular rhythm and normal heart sounds.  Pulmonary/Chest: Effort normal and breath sounds normal.  Abdominal: There is tenderness in the suprapubic area. There is no rebound, no guarding, no tenderness at McBurney's point and negative Murphy's sign.  Very mild suprapubic tenderness   Genitourinary: Vagina normal. Cervix exhibits discharge. Right adnexum displays no tenderness and no fullness. Left adnexum displays no tenderness and no fullness.  Genitourinary Comments: Moderate Thin white discharge with odor noted  Neurological: She is alert and oriented to person, place, and time.  Skin: Skin is warm and dry.     UC Treatments / Results  Labs (all labs ordered are listed, but only abnormal results are displayed) Labs Reviewed  POCT URINALYSIS DIP (DEVICE) - Abnormal; Notable for the following components:      Result Value   Nitrite POSITIVE (*)    All other components within normal limits  URINE CULTURE  POCT PREGNANCY, URINE  URINE CYTOLOGY ANCILLARY ONLY    EKG  EKG Interpretation None       Radiology No results found.  Procedures Procedures (including critical care time)  Medications Ordered in UC Medications - No data to display   Initial Impression / Assessment and Plan / UC Course  I have reviewed the triage vital signs and the nursing notes.  Pertinent labs & imaging results that were available during my care of the patient were reviewed by me and considered in my medical  decision making (see chart for details).      Negative pregnancy. Urine with nitrite, sent for culture. Without urinary symptoms. Vaginal discharge and odor, consistent with bv, flagyl initiated. URI without acute findings on exam, supportive cares. If symptoms worsen or do not improve in the next week to return to be seen or to follow up with PCP. Patient verbalized understanding and agreeable to plan.     Patient instructions:  Push fluids to ensure adequate hydration and  keep secretions thin.  Tylenol and/or ibuprofen as needed for pain or fevers.  Nasal spray as needed for congestion. Throat lozenges, sprays, honey teas etc as needed for sore throat. Miralax as needed for constipation, drink plenty of water. Flagyl for likely bacterial vaginosis. Cultures pending, will notify you if any changes to medications need to be made.  Final Clinical Impressions(s) / UC Diagnoses   Final diagnoses:  Upper respiratory tract infection, unspecified type  Vaginal discharge  Pelvic pain in female  Slow transit constipation    ED Discharge Orders        Ordered    metroNIDAZOLE (FLAGYL) 500 MG tablet  2 times daily     11/20/17 1111    polyethylene glycol powder (MIRALAX) powder  Daily PRN     11/20/17 1111    cromolyn (NASALCROM) 5.2 MG/ACT nasal spray  4 times daily     11/20/17 1111       Controlled Substance Prescriptions  Controlled Substance Registry consulted? Not Applicable   Georgetta Haber, NP 11/20/17 1116

## 2017-11-20 NOTE — Discharge Instructions (Signed)
Push fluids to ensure adequate hydration and keep secretions thin.  Tylenol and/or ibuprofen as needed for pain or fevers.  Nasal spray as needed for congestion. Throat lozenges, sprays, honey teas etc as needed for sore throat. Miralax as needed for constipation, drink plenty of water. Flagyl for likely bacterial vaginosis. Cultures pending, will notify you if any changes to medications need to be made. Please follow up with primary care provider or gynecologist for further evaluation and for well woman exam.

## 2017-11-21 LAB — URINE CULTURE: Culture: 80000 — AB

## 2017-11-21 LAB — URINE CYTOLOGY ANCILLARY ONLY
Chlamydia: NEGATIVE
Neisseria Gonorrhea: NEGATIVE
TRICH (WINDOWPATH): NEGATIVE

## 2017-11-22 ENCOUNTER — Telehealth (HOSPITAL_COMMUNITY): Payer: Self-pay | Admitting: Internal Medicine

## 2017-11-22 MED ORDER — CEPHALEXIN 500 MG PO CAPS: 500.0000 mg | ORAL_CAPSULE | Freq: Two times a day (BID) | ORAL | 0 refills | Status: AC

## 2017-11-22 NOTE — Telephone Encounter (Signed)
Clinical staff, please let the patient know that urine culture was positive for a Strep germ.  This is a finding of uncertain significance in a patient without urinary symptoms.  Pt did report pelvic pain at visit and UA was nitrite positive.  Will send rx for cephalexin to pharmacy of record, [facility] on [address] [facility] [place].   Tests for BV/yeast are still pending.   Recheck for further evaluation if symptoms are not improving.  LM

## 2017-11-23 LAB — URINE CYTOLOGY ANCILLARY ONLY
Bacterial vaginitis: POSITIVE — AB
Candida vaginitis: NEGATIVE

## 2018-04-10 ENCOUNTER — Other Ambulatory Visit: Payer: Self-pay

## 2018-04-10 ENCOUNTER — Inpatient Hospital Stay (HOSPITAL_COMMUNITY): Payer: Medicaid Other

## 2018-04-10 ENCOUNTER — Encounter (HOSPITAL_COMMUNITY): Payer: Self-pay | Admitting: *Deleted

## 2018-04-10 ENCOUNTER — Inpatient Hospital Stay (HOSPITAL_COMMUNITY)
Admission: AD | Admit: 2018-04-10 | Discharge: 2018-04-10 | Disposition: A | Discharge status: Home / Self Care | Payer: Medicaid Other | Source: Ambulatory Visit | Attending: Obstetrics and Gynecology | Admitting: Obstetrics and Gynecology

## 2018-04-10 DIAGNOSIS — O26891 Other specified pregnancy related conditions, first trimester: Secondary | ICD-10-CM | POA: Diagnosis not present

## 2018-04-10 DIAGNOSIS — Z3491 Encounter for supervision of normal pregnancy, unspecified, first trimester: Secondary | ICD-10-CM

## 2018-04-10 DIAGNOSIS — Z7982 Long term (current) use of aspirin: Secondary | ICD-10-CM | POA: Diagnosis not present

## 2018-04-10 DIAGNOSIS — O219 Vomiting of pregnancy, unspecified: Secondary | ICD-10-CM

## 2018-04-10 DIAGNOSIS — O210 Mild hyperemesis gravidarum: Secondary | ICD-10-CM | POA: Insufficient documentation

## 2018-04-10 DIAGNOSIS — Z3A01 Less than 8 weeks gestation of pregnancy: Secondary | ICD-10-CM | POA: Insufficient documentation

## 2018-04-10 DIAGNOSIS — R109 Unspecified abdominal pain: Secondary | ICD-10-CM | POA: Insufficient documentation

## 2018-04-10 DIAGNOSIS — Z87891 Personal history of nicotine dependence: Secondary | ICD-10-CM | POA: Diagnosis not present

## 2018-04-10 HISTORY — DX: Anemia, unspecified: D64.9

## 2018-04-10 LAB — CBC
HCT: 37.6 % (ref 36.0–46.0)
HEMOGLOBIN: 13.3 g/dL (ref 12.0–15.0)
MCH: 33.1 pg (ref 26.0–34.0)
MCHC: 35.4 g/dL (ref 30.0–36.0)
MCV: 93.5 fL (ref 78.0–100.0)
PLATELETS: 125 10*3/uL — AB (ref 150–400)
RBC: 4.02 MIL/uL (ref 3.87–5.11)
RDW: 12.9 % (ref 11.5–15.5)
WBC: 4.8 10*3/uL (ref 4.0–10.5)

## 2018-04-10 LAB — WET PREP, GENITAL
Sperm: NONE SEEN
TRICH WET PREP: NONE SEEN
Yeast Wet Prep HPF POC: NONE SEEN

## 2018-04-10 LAB — URINALYSIS, ROUTINE W REFLEX MICROSCOPIC
Bilirubin Urine: NEGATIVE
GLUCOSE, UA: NEGATIVE mg/dL
Hgb urine dipstick: NEGATIVE
KETONES UR: NEGATIVE mg/dL
LEUKOCYTES UA: NEGATIVE
NITRITE: NEGATIVE
PH: 6 (ref 5.0–8.0)
Protein, ur: NEGATIVE mg/dL
SPECIFIC GRAVITY, URINE: 1.024 (ref 1.005–1.030)

## 2018-04-10 LAB — POCT PREGNANCY, URINE: Preg Test, Ur: POSITIVE — AB

## 2018-04-10 LAB — HCG, QUANTITATIVE, PREGNANCY: hCG, Beta Chain, Quant, S: 48442 m[IU]/mL — ABNORMAL HIGH (ref <5)

## 2018-04-10 MED ORDER — METOCLOPRAMIDE HCL 10 MG PO TABS: 10.0000 mg | ORAL_TABLET | Freq: Three times a day (TID) | ORAL | 0 refills | Status: DC | PRN

## 2018-04-10 NOTE — MAU Note (Signed)
+  HPT approx 2 wks ago.  Has been having cramping and abd pain, wanted to make sure everything is ok.  Hx of SAB.  Tried to get into new OB, doesn't have preg medicaid.

## 2018-04-10 NOTE — Discharge Instructions (Signed)
Morning Sickness Morning sickness is when you feel sick to your stomach (nauseous) during pregnancy. This nauseous feeling may or may not come with vomiting. It often occurs in the morning but can be a problem any time of day. Morning sickness is most common during the first trimester, but it may continue throughout pregnancy. While morning sickness is unpleasant, it is usually harmless unless you develop severe and continual vomiting (hyperemesis gravidarum). This condition requires more intense treatment. What are the causes? The cause of morning sickness is not completely known but seems to be related to normal hormonal changes that occur in pregnancy. What increases the risk? You are at greater risk if you:  Experienced nausea or vomiting before your pregnancy.  Had morning sickness during a previous pregnancy.  Are pregnant with more than one baby, such as twins.  How is this treated? Do not use any medicines (prescription, over-the-counter, or herbal) for morning sickness without first talking to your health care provider. Your health care provider may prescribe or recommend:  Vitamin B6 supplements.  Anti-nausea medicines.  The herbal medicine ginger.  Follow these instructions at home:  Only take over-the-counter or prescription medicines as directed by your health care provider.  Taking multivitamins before getting pregnant can prevent or decrease the severity of morning sickness in most women.  Eat a piece of dry toast or unsalted crackers before getting out of bed in the morning.  Eat five or six small meals a day.  Eat dry and bland foods (rice, baked potato). Foods high in carbohydrates are often helpful.  Do not drink liquids with your meals. Drink liquids between meals.  Avoid greasy, fatty, and spicy foods.  Get someone to cook for you if the smell of any food causes nausea and vomiting.  If you feel nauseous after taking prenatal vitamins, take the vitamins at  night or with a snack.  Snack on protein foods (nuts, yogurt, cheese) between meals if you are hungry.  Eat unsweetened gelatins for desserts.  Wearing an acupressure wristband (worn for sea sickness) may be helpful.  Acupuncture may be helpful.  Do not smoke.  Get a humidifier to keep the air in your house free of odors.  Get plenty of fresh air. Contact a health care provider if:  Your home remedies are not working, and you need medicine.  You feel dizzy or lightheaded.  You are losing weight. Get help right away if:  You have persistent and uncontrolled nausea and vomiting.  You pass out (faint). This information is not intended to replace advice given to you by your health care provider. Make sure you discuss any questions you have with your health care provider. Document Released: 12/07/2006 Document Revised: 03/23/2016 Document Reviewed: 04/02/2013 Elsevier Interactive Patient Education  2017 [employer].    Constipation, Adult Constipation is when a person has fewer bowel movements in a week than normal, has difficulty having a bowel movement, or has stools that are dry, hard, or larger than normal. Constipation may be caused by an underlying condition. It may become worse with age if a person takes certain medicines and does not take in enough fluids. Follow these instructions at home: Eating and drinking   Eat foods that have a lot of fiber, such as fresh fruits and vegetables, whole grains, and beans.  Limit foods that are high in fat, low in fiber, or overly processed, such as french fries, hamburgers, cookies, candies, and soda.  Drink enough fluid to keep your urine  clear or pale yellow. General instructions  Exercise regularly or as told by your health care provider.  Go to the restroom when you have the urge to go. Do not hold it in.  Take over-the-counter and prescription medicines only as told by your health care provider. These include any fiber  supplements.  Practice pelvic floor retraining exercises, such as deep breathing while relaxing the lower abdomen and pelvic floor relaxation during bowel movements.  Watch your condition for any changes.  Keep all follow-up visits as told by your health care provider. This is important. Contact a health care provider if:  You have pain that gets worse.  You have a fever.  You do not have a bowel movement after 4 days.  You vomit.  You are not hungry.  You lose weight.  You are bleeding from the anus.  You have thin, pencil-like stools. Get help right away if:  You have a fever and your symptoms suddenly get worse.  You leak stool or have blood in your stool.  Your abdomen is bloated.  You have severe pain in your abdomen.  You feel dizzy or you faint. This information is not intended to replace advice given to you by your health care provider. Make sure you discuss any questions you have with your health care provider. Document Released: 07/14/2004 Document Revised: 05/05/2016 Document Reviewed: 04/05/2016 Elsevier Interactive Patient Education  2018 [employer]

## 2018-04-10 NOTE — MAU Provider Note (Signed)
History     CSN: [id number]  Arrival date and time: 04/10/18 1427   First Provider Initiated Contact with Patient 04/10/18 1516      Chief Complaint  Patient presents with  . Abdominal Pain  . Possible Pregnancy   HPI  DAMIRA KEM is a 22 y.o. G3P1011 at [redacted]w[redacted]d by LMP who presents with abdominal pain. Symptoms began last week. Reports generalized abdominal pain that moves locations; points to left & right mid abdomen, lower abdomen, and epigastric area. Pain is intermittent. Rates pain 5/10. Has not treated symptoms. Endorses nausea that is worse with eating. No vomiting and is not treating nausea. Last BM was 2 days ago; reports constipation since episode of diarrhea last week. Denies dysuria, vaginal discharge, or vaginal bleeding.   OB History    Gravida  3   Para  1   Term  1   Preterm      AB  1   Living  1     SAB  1   TAB      Ectopic      Multiple  0   Live Births  1           Past Medical History:  Diagnosis Date  . Anemia   . Chlamydia   . Migraine     Past Surgical History:  Procedure Laterality Date  . WISDOM TOOTH EXTRACTION      Family History  Problem Relation Age of Onset  . Diabetes Paternal Grandmother     Social History   Tobacco Use  . Smoking status: Former Smoker    Types: Cigars  . Smokeless tobacco: Never Used  Substance Use Topics  . Alcohol use: No  . Drug use: No    Allergies: No Known Allergies  Medications Prior to Admission  Medication Sig Dispense Refill Last Dose  . aspirin-acetaminophen-caffeine (EXCEDRIN MIGRAINE) 250-250-65 MG tablet Take 2 tablets by mouth every 6 (six) hours as needed for headache.   07/30/2017 at Unknown time  . butalbital-acetaminophen-caffeine (FIORICET, ESGIC) 50-325-40 MG tablet Take 1 tablet by mouth every 6 (six) hours as needed for headache. 20 tablet 0   . cromolyn (NASALCROM) 5.2 MG/ACT nasal spray Place 1 spray into both nostrils 4 (four) times daily. 26 mL 12   .  naproxen sodium (ANAPROX) 220 MG tablet Take 440 mg by mouth 2 (two) times daily with a meal.   Past Month at Unknown time  . polyethylene glycol powder (MIRALAX) powder Take 17 g by mouth daily as needed. 255 g 0     Review of Systems  Constitutional: Negative.   Gastrointestinal: Positive for abdominal pain, constipation and nausea. Negative for diarrhea and vomiting.  Genitourinary: Negative.    Physical Exam   Blood pressure 114/77, pulse 77, temperature 98.8 F (37.1 C), temperature source Oral, resp. rate 16, height 5' 3.5" (1.613 m), weight 146 lb (66.2 kg), last menstrual period 02/26/2018, SpO2 100 %, unknown if currently breastfeeding.  Physical Exam  Nursing note and vitals reviewed. Constitutional: She is oriented to person, place, and time. She appears well-developed and well-nourished. No distress.  HENT:  Head: Normocephalic and atraumatic.  Eyes: Conjunctivae are normal. Right eye exhibits no discharge. Left eye exhibits no discharge. No scleral icterus.  Neck: Normal range of motion.  Respiratory: Effort normal. No respiratory distress.  GI: Soft. Bowel sounds are normal. She exhibits no distension. There is no tenderness. There is no rebound and no guarding.  Neurological: She is  alert and oriented to person, place, and time.  Skin: Skin is warm and dry. She is not diaphoretic.  Psychiatric: She has a normal mood and affect. Her behavior is normal. Judgment and thought content normal.    MAU Course  Procedures Results for orders placed or performed during the hospital encounter of 04/10/18 (from the past 24 hour(s))  Urinalysis, Routine w reflex microscopic     Status: None   Collection Time: 04/10/18  1:05 PM  Result Value Ref Range   Color, Urine YELLOW YELLOW   APPearance CLEAR CLEAR   Specific Gravity, Urine 1.024 1.005 - 1.030   pH 6.0 5.0 - 8.0   Glucose, UA NEGATIVE NEGATIVE mg/dL   Hgb urine dipstick NEGATIVE NEGATIVE   Bilirubin Urine NEGATIVE  NEGATIVE   Ketones, ur NEGATIVE NEGATIVE mg/dL   Protein, ur NEGATIVE NEGATIVE mg/dL   Nitrite NEGATIVE NEGATIVE   Leukocytes, UA NEGATIVE NEGATIVE  Pregnancy, urine POC     Status: Abnormal   Collection Time: 04/10/18  3:07 PM  Result Value Ref Range   Preg Test, Ur POSITIVE (A) NEGATIVE  CBC     Status: Abnormal   Collection Time: 04/10/18  3:35 PM  Result Value Ref Range   WBC 4.8 4.0 - 10.5 K/uL   RBC 4.02 3.87 - 5.11 MIL/uL   Hemoglobin 13.3 12.0 - 15.0 g/dL   HCT 40.937.6 81.136.0 - 91.446.0 %   MCV 93.5 78.0 - 100.0 fL   MCH 33.1 26.0 - 34.0 pg   MCHC 35.4 30.0 - 36.0 g/dL   RDW 78.212.9 95.611.5 - 21.315.5 %   Platelets 125 (L) 150 - 400 K/uL  hCG, quantitative, pregnancy     Status: Abnormal   Collection Time: 04/10/18  3:35 PM  Result Value Ref Range   hCG, Beta Chain, Quant, S 48,442 (H) <5 mIU/mL  Wet prep, genital     Status: Abnormal   Collection Time: 04/10/18  4:03 PM  Result Value Ref Range   Yeast Wet Prep HPF POC NONE SEEN NONE SEEN   Trich, Wet Prep NONE SEEN NONE SEEN   Clue Cells Wet Prep HPF POC PRESENT (A) NONE SEEN   WBC, Wet Prep HPF POC MODERATE (A) NONE SEEN   Sperm NONE SEEN    [place] Ob Less Than 14 Weeks With Ob Transvaginal  Result Date: 04/10/2018 CLINICAL DATA:  Pregnant, pain EXAM: OBSTETRIC <14 WK US AND TRANSVAGINAL OB US TECHNIQUE: Both transabdominal and transvaginal ultrasound examinations were performed for complete evaluation of the gestation as well as the maternal uterus, adnexal regions, and pelvic cul-de-sac. Transvaginal technique was performed to assess early pregnancy. COMPARISON:  None. FINDINGS: Intrauterine gestational sac: Single Yolk sac:  Visualized. Embryo:  Visualized. Cardiac Activity: Visualized. Heart Rate: 121 bpm CRL:  4.6 mm   6 w   1 d                  US EDC: 12/03/2018 Subchorionic hemorrhage:  None visualized. Maternal uterus/adnexae: Bilateral ovaries are within normal limits, noting a left corpus luteal cyst. No free fluid. IMPRESSION:  Single live intrauterine gestation, with estimated gestational age [redacted] weeks 1 day by crown-rump length, as above. Electronically Signed   By: Charline BillsSriyesh  Krishnan M.D.   On: 04/10/2018 16:45     MDM +UPT UA, wet prep, GC/chlamydia, CBC, ABO/Rh, quant hCG, and US today to rule out ectopic pregnancy O positive  Ultrasound shows SIUP with cardiac activity Wet prep + clue cells. Pt without  vaginal discharge. Will defer treatment per conversation with pt.  Assessment and Plan  A: 1. Normal IUP (intrauterine pregnancy) on prenatal ultrasound, first trimester   2. Abdominal pain during pregnancy in first trimester   3. Nausea and vomiting during pregnancy prior to [redacted] weeks gestation    P: Discharge home Rx reglan GC/CT pending Discussed reasons to return to MAU Start prenatal care  Judeth Horn 04/10/2018, 3:16 PM

## 2018-04-11 NOTE — Progress Notes (Signed)
RN [facility] 04/11/2018 @ 0815 reporting specimen received yesterday not tested secondary specimen not labeled.

## 2018-05-19 ENCOUNTER — Other Ambulatory Visit: Payer: Self-pay

## 2018-05-19 ENCOUNTER — Encounter (HOSPITAL_COMMUNITY): Payer: Self-pay

## 2018-05-19 ENCOUNTER — Inpatient Hospital Stay (HOSPITAL_COMMUNITY)
Admission: AD | Admit: 2018-05-19 | Discharge: 2018-05-19 | Disposition: A | Discharge status: Home / Self Care | Payer: Medicaid Other | Source: Ambulatory Visit | Attending: Obstetrics and Gynecology | Admitting: Obstetrics and Gynecology

## 2018-05-19 DIAGNOSIS — O210 Mild hyperemesis gravidarum: Secondary | ICD-10-CM | POA: Insufficient documentation

## 2018-05-19 DIAGNOSIS — Z87891 Personal history of nicotine dependence: Secondary | ICD-10-CM | POA: Insufficient documentation

## 2018-05-19 DIAGNOSIS — Z3A11 11 weeks gestation of pregnancy: Secondary | ICD-10-CM | POA: Diagnosis not present

## 2018-05-19 DIAGNOSIS — O23591 Infection of other part of genital tract in pregnancy, first trimester: Secondary | ICD-10-CM | POA: Diagnosis not present

## 2018-05-19 DIAGNOSIS — B9689 Other specified bacterial agents as the cause of diseases classified elsewhere: Secondary | ICD-10-CM | POA: Diagnosis not present

## 2018-05-19 DIAGNOSIS — N760 Acute vaginitis: Secondary | ICD-10-CM

## 2018-05-19 DIAGNOSIS — O2341 Unspecified infection of urinary tract in pregnancy, first trimester: Secondary | ICD-10-CM | POA: Diagnosis not present

## 2018-05-19 LAB — CBC
HCT: 35 % — ABNORMAL LOW (ref 36.0–46.0)
Hemoglobin: 12.4 g/dL (ref 12.0–15.0)
MCH: 33 pg (ref 26.0–34.0)
MCHC: 35.4 g/dL (ref 30.0–36.0)
MCV: 93.1 fL (ref 78.0–100.0)
PLATELETS: 121 10*3/uL — AB (ref 150–400)
RBC: 3.76 MIL/uL — ABNORMAL LOW (ref 3.87–5.11)
RDW: 13 % (ref 11.5–15.5)
WBC: 5.7 10*3/uL (ref 4.0–10.5)

## 2018-05-19 LAB — URINALYSIS, ROUTINE W REFLEX MICROSCOPIC
BILIRUBIN URINE: NEGATIVE
Bacteria, UA: NONE SEEN
Glucose, UA: NEGATIVE mg/dL
Hgb urine dipstick: NEGATIVE
Ketones, ur: NEGATIVE mg/dL
Leukocytes, UA: NEGATIVE
Nitrite: NEGATIVE
PH: 8 (ref 5.0–8.0)
Protein, ur: 30 mg/dL — AB
SPECIFIC GRAVITY, URINE: 1.018 (ref 1.005–1.030)

## 2018-05-19 LAB — WET PREP, GENITAL
Sperm: NONE SEEN
Trich, Wet Prep: NONE SEEN
Yeast Wet Prep HPF POC: NONE SEEN

## 2018-05-19 MED ORDER — CEPHALEXIN 500 MG PO CAPS: 500.0000 mg | ORAL_CAPSULE | Freq: Four times a day (QID) | ORAL | 0 refills | Status: AC

## 2018-05-19 MED ORDER — METRONIDAZOLE 500 MG PO TABS: 500.0000 mg | ORAL_TABLET | Freq: Two times a day (BID) | ORAL | 0 refills | Status: DC

## 2018-05-19 NOTE — MAU Note (Signed)
Urine in lab 

## 2018-05-19 NOTE — Discharge Instructions (Signed)
Asymptomatic Bacteriuria Asymptomatic bacteriuria is the presence of a large number of bacteria in the urine without the usual symptoms of burning or frequent urination. What are the causes? This condition is caused by an increase in bacteria in the urine. This increase can be caused by:  Bacteria entering the urinary tract, such as during sex.  A blockage in the urinary tract, such as from kidney stones or a tumor.  Bladder problems that prevent the bladder from emptying.  What increases the risk? You are more likely to develop this condition if:  You have diabetes mellitus.  You are an elderly adult, especially if you are also in a long-term care facility.  You are pregnant and in the first trimester.  You have kidney stones.  You are female.  You have had a kidney transplant.  You have a leaky kidney tube valve (reflux).  You had a urinary catheter for a long period of time.  What are the signs or symptoms? There are no symptoms of this condition. How is this diagnosed? This condition is diagnosed with a urine test. Because this condition does not cause symptoms, it is usually diagnosed when a urine sample is taken to treat or diagnose another condition, such as pregnancy or kidney problems. Most women who are in their first trimester of pregnancy are screened for asymptomatic bacteriuria. How is this treated? Usually, treatment is not needed for this condition. Treating the condition can lead to other problems, such as a yeast infection or the growth of bacteria that do not respond to treatment (antibiotic-resistant bacteria). Some people, such as pregnant women and people with kidney transplants, do need treatment with antibiotic medicines to prevent kidney infection (pyelonephritis). In pregnant women, kidney infection can lead to premature labor, fetal growth restriction, or newborn death. Follow these instructions at home: Medicines  Take over-the-counter and  prescription medicines only as told by your health care provider.  If you were prescribed an antibiotic medicine, take it as told by your health care provider. Do not stop taking the antibiotic even if you start to feel better. General instructions  Monitor your condition for any changes.  Drink enough fluid to keep your urine clear or pale yellow.  Go to the bathroom more often to keep your bladder empty.  If you are female, keep the area around your vagina and rectum clean. Wipe yourself from front to back after urinating.  Keep all follow-up visits as told by your health care provider. This is important. Contact a health care provider if:  You notice any new symptoms, such as back pain or burning while urinating. Get help right away if:  You develop signs of an infection such as: ? A burning sensation when you urinate. ? Have pain when you urinate. ? Develop an intense need to urinate. ? Urinating more frequently. ? Back pain or pelvic pain. ? Fever or chills.  You have blood in your urine.  Your urine becomes discolored or cloudy.  Your urine smells bad.  You have severe pain that cannot be controlled with medicine. Summary  Asymptomatic bacteriuria is the presence of a large number of bacteria in the urine without the usual symptoms of burning or frequent urination.  Usually, treatment is not needed for this condition. Treating the condition can lead to other problems, such as too much yeast and the growth of antibiotic-resistant bacteria.  Some people, such as pregnant women and people with kidney transplants, do need treatment with antibiotic medicines to prevent   kidney infection (pyelonephritis).  If you were prescribed an antibiotic medicine, take it as told by your health care provider. Do not stop taking the antibiotic even if you start to feel better. This information is not intended to replace advice given to you by your health care provider. Make sure you  discuss any questions you have with your health care provider. Document Released: 10/16/2005 Document Revised: 10/10/2016 Document Reviewed: 10/10/2016 Elsevier Interactive Patient Education  2017 [employer]  

## 2018-05-19 NOTE — MAU Note (Addendum)
G2P1 @ 11.[redacted] wksga. Here for N/V. Symptoms started at about [redacted]wksga. also c/o cramping started yesterday on right lower abdominal area in addition to migraines. Has hx of the migraines. States tylenol does not help.  Denies LOF or bleeding.   1535: provider at bs assessing. Doppler attempted. Unable to obtain by provider. Wetprep and GC done. Specimen verified with pt and sent to lab  1655: lab at bs.

## 2018-05-19 NOTE — MAU Provider Note (Addendum)
History     CSN: [id number]  Arrival date and time: 05/19/18 1443   First Provider Initiated Contact with Patient 05/19/18 1534      Chief Complaint  Patient presents with  . Morning Sickness   HPI  Patricia Butler is a 22 y.o. G3P1011 at [redacted]w[redacted]d who presents to MAU with multiple complaints including  Nausea/vomiting, RLQ abdominal pain, and migraines. Denies vaginal bleeding, leaking of fluid, decreased  fever, falls, or recent illness.    Patient verbally requesting "head to toe checkup". States she has not had a full health assessment recently and verbalized concerns that she has not experienced any of these symptoms with her other pregnancies.  Nausea/Vomiting Existing problem, onset coincides with positive pregnancy test, per patient.  Able to tolerate PO intake. Aggravated by smells, particular foods. Unable to identify alleviating factors. Has tried to manage with Tylenol.   RLQ Abdominal Pain Existing problem, onset coincides with onset of nausea/vomiting. Unable to rate pain. Pain aggravated by vomiting. Unaware of alleviating factors. States she was afraid to take medication due to early gestational age.   OB History    Gravida  3   Para  1   Term  1   Preterm      AB  1   Living  1     SAB  1   TAB      Ectopic      Multiple  0   Live Births  1           Past Medical History:  Diagnosis Date  . Anemia   . Chlamydia   . Migraine     Past Surgical History:  Procedure Laterality Date  . WISDOM TOOTH EXTRACTION      Family History  Problem Relation Age of Onset  . Diabetes Paternal Grandmother     Social History   Tobacco Use  . Smoking status: Former Smoker    Types: Cigars  . Smokeless tobacco: Never Used  Substance Use Topics  . Alcohol use: No  . Drug use: No    Allergies: No Known Allergies  Medications Prior to Admission  Medication Sig Dispense Refill Last Dose  . Biotin w/ Vitamins C & E (HAIR SKIN & NAILS GUMMIES PO)  Take 2 each by mouth daily.   Past Month at Unknown time  . Cranberry-Vitamin C-Probiotic (AZO CRANBERRY) 250-30 MG TABS Take 2 tablets by mouth daily.   Past Month at Unknown time  . cromolyn (NASALCROM) 5.2 MG/ACT nasal spray Place 1 spray into both nostrils 4 (four) times daily. (Patient not taking: Reported on 04/10/2018) 26 mL 12 Not Taking at Unknown time  . metoCLOPramide (REGLAN) 10 MG tablet Take 1 tablet (10 mg total) by mouth every 8 (eight) hours as needed for nausea. 30 tablet 0   . polyethylene glycol powder (MIRALAX) powder Take 17 g by mouth daily as needed. (Patient not taking: Reported on 04/10/2018) 255 g 0 Not Taking at Unknown time  . Probiotic Product (PROBIOTIC PO) Take 1 capsule by mouth daily.   Past Month at Unknown time    Review of Systems  Constitutional: Positive for fatigue. Negative for chills and fever.  Gastrointestinal: Positive for abdominal pain, nausea and vomiting.  Genitourinary: Positive for vaginal discharge. Negative for vaginal bleeding and vaginal pain.  Neurological: Positive for headaches.  All other systems reviewed and are negative.  Physical Exam   Blood pressure 121/67, pulse 79, temperature 98.5 F (36.9 C), temperature source  Oral, resp. rate 17, weight 145 lb 0.6 oz (65.8 kg), last menstrual period 02/26/2018, SpO2 100 %, unknown if currently breastfeeding.  Physical Exam  Nursing note and vitals reviewed. Constitutional: She is oriented to person, place, and time. She appears well-developed and well-nourished.  HENT:  Head: Normocephalic.  Cardiovascular: Normal rate, regular rhythm, normal heart sounds and intact distal pulses.  Respiratory: Effort normal and breath sounds normal.  GI: Soft. Bowel sounds are normal. She exhibits no distension and no mass. There is no tenderness. There is no rebound and no guarding.  Genitourinary: Uterus normal. Vaginal discharge found.  Musculoskeletal: Normal range of motion.  Neurological: She is  alert and oriented to person, place, and time. She has normal reflexes.  Skin: Skin is warm and dry.  Psychiatric: She has a normal mood and affect. Her behavior is normal. Judgment and thought content normal.    MAU Course  Procedures  MDM --SIUP confirmed 04/10/2018 --FHT auscultated at 160 bpm via bedside ultrasound performed by H. Mathews RobinsonsHogan, CNM   Patient Vitals for the past 24 hrs:  BP Temp Temp src Pulse Resp SpO2 Weight  05/19/18 1735 117/76 - - 64 17 100 % -  05/19/18 1501 121/67 98.5 F (36.9 C) Oral 79 17 100 % 145 lb 0.6 oz (65.8 kg)   Orders Placed This Encounter  Procedures  . Wet prep, genital  . Culture, OB Urine  . Urinalysis, Routine w reflex microscopic  . CBC  . Discharge patient   Recent Results (from the past 2160 hour(s))  Urinalysis, Routine w reflex microscopic     Status: None   Collection Time: 04/10/18  1:05 PM  Result Value Ref Range   Color, Urine YELLOW YELLOW   APPearance CLEAR CLEAR   Specific Gravity, Urine 1.024 1.005 - 1.030   pH 6.0 5.0 - 8.0   Glucose, UA NEGATIVE NEGATIVE mg/dL   Hgb urine dipstick NEGATIVE NEGATIVE   Bilirubin Urine NEGATIVE NEGATIVE   Ketones, ur NEGATIVE NEGATIVE mg/dL   Protein, ur NEGATIVE NEGATIVE mg/dL   Nitrite NEGATIVE NEGATIVE   Leukocytes, UA NEGATIVE NEGATIVE    Comment: Performed at [facility], [address] [place], [place] [address]  Pregnancy, urine POC     Status: Abnormal   Collection Time: 04/10/18  3:07 PM  Result Value Ref Range   Preg Test, Ur POSITIVE (A) NEGATIVE    Comment:        THE SENSITIVITY OF THIS METHODOLOGY IS >24 mIU/mL   CBC     Status: Abnormal   Collection Time: 04/10/18  3:35 PM  Result Value Ref Range   WBC 4.8 4.0 - 10.5 K/uL   RBC 4.02 3.87 - 5.11 MIL/uL   Hemoglobin 13.3 12.0 - 15.0 g/dL   HCT 62.137.6 30.836.0 - 65.746.0 %   MCV 93.5 78.0 - 100.0 fL   MCH 33.1 26.0 - 34.0 pg   MCHC 35.4 30.0 - 36.0 g/dL   RDW 84.612.9 96.211.5 - 95.215.5 %   Platelets 125 (L) 150 - 400 K/uL     Comment: Performed at [facility], [address] [place], [place] [address]  hCG, quantitative, pregnancy     Status: Abnormal   Collection Time: 04/10/18  3:35 PM  Result Value Ref Range   hCG, Beta Chain, Quant, S 48,442 (H) <5 mIU/mL    Comment:          GEST. AGE      CONC.  (mIU/mL)   <=1 WEEK  5 - 50     2 WEEKS       50 - 500     3 WEEKS       100 - 10,000     4 WEEKS     1,000 - 30,000     5 WEEKS     3,500 - 115,000   6-8 WEEKS     12,000 - 270,000    12 WEEKS     15,000 - 220,000        FEMALE AND NON-PREGNANT FEMALE:     LESS THAN 5 mIU/mL Performed at [facility], [address], [place], [place] [address]   Wet prep, genital     Status: Abnormal   Collection Time: 04/10/18  4:03 PM  Result Value Ref Range   Yeast Wet Prep HPF POC NONE SEEN NONE SEEN   Trich, Wet Prep NONE SEEN NONE SEEN   Clue Cells Wet Prep HPF POC PRESENT (A) NONE SEEN    Comment: CORRECTED RESULTS CALLED TO: WESTON,L AT 1625 ON 04/10/18 BY HAGGINSC CORRECTED ON 06/12 AT 1627: PREVIOUSLY REPORTED AS NONE SEEN    WBC, Wet Prep HPF POC MODERATE (A) NONE SEEN    Comment: CORRECTED RESULTS CALLED TO: WESTON,L AT 1625 ON 04/10/18 BY HAGGINSC CORRECTED ON 06/12 AT 1629: PREVIOUSLY REPORTED AS FEW    Sperm NONE SEEN     Comment: Performed at [facility], [address], [place], [place] [address]  Urinalysis, Routine w reflex microscopic     Status: Abnormal   Collection Time: 05/19/18  3:23 PM  Result Value Ref Range   Color, Urine YELLOW YELLOW   APPearance CLOUDY (A) CLEAR   Specific Gravity, Urine 1.018 1.005 - 1.030   pH 8.0 5.0 - 8.0   Glucose, UA NEGATIVE NEGATIVE mg/dL   Hgb urine dipstick NEGATIVE NEGATIVE   Bilirubin Urine NEGATIVE NEGATIVE   Ketones, ur NEGATIVE NEGATIVE mg/dL   Protein, ur 30 (A) NEGATIVE mg/dL   Nitrite NEGATIVE NEGATIVE   Leukocytes, UA NEGATIVE NEGATIVE   RBC / HPF 0-5 0 - 5 RBC/hpf   Bacteria, UA NONE SEEN NONE SEEN   Squamous Epithelial /  LPF 6-10 0 - 5   Mucus PRESENT     Comment: Performed at [facility], [address], [place], [place] [address]  Wet prep, genital     Status: Abnormal   Collection Time: 05/19/18  3:46 PM  Result Value Ref Range   Yeast Wet Prep HPF POC NONE SEEN NONE SEEN   Trich, Wet Prep NONE SEEN NONE SEEN   Clue Cells Wet Prep HPF POC PRESENT (A) NONE SEEN   WBC, Wet Prep HPF POC MANY (A) NONE SEEN    Comment: MANY BACTERIA SEEN   Sperm NONE SEEN     Comment: Performed at [facility], [address], [place], [place] [address]  CBC     Status: Abnormal   Collection Time: 05/19/18  5:01 PM  Result Value Ref Range   WBC 5.7 4.0 - 10.5 K/uL   RBC 3.76 (L) 3.87 - 5.11 MIL/uL   Hemoglobin 12.4 12.0 - 15.0 g/dL   HCT 65.7 (L) 84.6 - 96.2 %   MCV 93.1 78.0 - 100.0 fL   MCH 33.0 26.0 - 34.0 pg   MCHC 35.4 30.0 - 36.0 g/dL   RDW 95.2 84.1 - 32.4 %   Platelets 121 (L) 150 - 400 K/uL    Comment: Performed at [facility], [address] [place], [place] [address]   Meds ordered this encounter  Medications  . cephALEXin (KEFLEX) 500 MG capsule    Sig: Take 1 capsule (500 mg total) by mouth 4 (four) times daily for 10 days.    Dispense:  40 capsule    Refill:  0    Order Specific Question:   Supervising Provider    Answer:   Reva Bores [[id number]]  . metroNIDAZOLE (FLAGYL) 500 MG tablet    Sig: Take 1 tablet (500 mg total) by mouth 2 (two) times daily.    Dispense:  14 tablet    Refill:  0    Order Specific Question:   Supervising Provider    Answer:   Reva Bores [[id number]]    Assessment and Plan  --[redacted]w[redacted]d G3P1011 with SIUP at [redacted]w[redacted]d  --Bacterial vaginosis in pregnancy, paper rx given as identified above --UTI in pregnancy, paper rx for Keflex given as identified above, urine culture ordered --FHT auscultated at 160 bpm --Reviewed general obstetric precautions including but not limited to falls, fever, vaginal bleeding, leaking of    fluid, headache not relieved by Tylenol, rest  and PO hydration. --Discharge home in stable condition   Calvert Cantor, [place] 05/19/2018, 5:42 PM

## 2018-05-20 LAB — CULTURE, OB URINE: Culture: NO GROWTH

## 2018-05-20 LAB — GC/CHLAMYDIA PROBE AMP (~~LOC~~) NOT AT ARMC
Chlamydia: NEGATIVE
Neisseria Gonorrhea: NEGATIVE

## 2018-06-10 ENCOUNTER — Other Ambulatory Visit (HOSPITAL_COMMUNITY): Payer: Self-pay

## 2018-06-10 DIAGNOSIS — Z3689 Encounter for other specified antenatal screening: Secondary | ICD-10-CM

## 2018-06-10 DIAGNOSIS — Z3A19 19 weeks gestation of pregnancy: Secondary | ICD-10-CM

## 2018-06-27 ENCOUNTER — Encounter (HOSPITAL_COMMUNITY): Payer: Self-pay

## 2018-07-04 ENCOUNTER — Ambulatory Visit (HOSPITAL_COMMUNITY)
Admission: RE | Admit: 2018-07-04 | Discharge: 2018-07-04 | Disposition: A | Discharge status: Home / Self Care | Payer: Medicaid Other | Source: Ambulatory Visit

## 2018-07-04 DIAGNOSIS — Z3A18 18 weeks gestation of pregnancy: Secondary | ICD-10-CM

## 2018-07-04 DIAGNOSIS — Z3689 Encounter for other specified antenatal screening: Secondary | ICD-10-CM

## 2018-07-04 DIAGNOSIS — Z3482 Encounter for supervision of other normal pregnancy, second trimester: Secondary | ICD-10-CM | POA: Diagnosis present

## 2018-07-04 DIAGNOSIS — Z363 Encounter for antenatal screening for malformations: Secondary | ICD-10-CM

## 2018-07-04 DIAGNOSIS — Z3A19 19 weeks gestation of pregnancy: Secondary | ICD-10-CM

## 2018-08-12 ENCOUNTER — Ambulatory Visit: Payer: Medicaid Other | Admitting: Neurology

## 2018-08-12 ENCOUNTER — Encounter: Payer: Self-pay | Admitting: *Deleted

## 2018-08-12 ENCOUNTER — Encounter: Payer: Self-pay | Admitting: Neurology

## 2018-08-12 VITALS — BP 100/61 | HR 80 | Ht 63.0 in | Wt 157.0 lb

## 2018-08-12 DIAGNOSIS — G43709 Chronic migraine without aura, not intractable, without status migrainosus: Secondary | ICD-10-CM | POA: Diagnosis not present

## 2018-08-12 NOTE — Patient Instructions (Signed)
Come back in March In the meantime email me   Migraine Headache A migraine headache is an intense, throbbing pain on one side or both sides of the head. Migraines may also cause other symptoms, such as nausea, vomiting, and sensitivity to light and noise. What are the causes? Doing or taking certain things may also trigger migraines, such as:  Alcohol.  Smoking.  Medicines, such as: ? Medicine used to treat chest pain (nitroglycerine). ? Birth control pills. ? Estrogen pills. ? Certain blood pressure medicines.  Aged cheeses, chocolate, or caffeine.  Foods or drinks that contain nitrates, glutamate, aspartame, or tyramine.  Physical activity.  Other things that may trigger a migraine include:  Menstruation.  Pregnancy.  Hunger.  Stress, lack of sleep, too much sleep, or fatigue.  Weather changes.  What increases the risk? The following factors may make you more likely to experience migraine headaches:  Age. Risk increases with age.  Family history of migraine headaches.  Being Caucasian.  Depression and anxiety.  Obesity.  Being a woman.  Having a hole in the heart (patent foramen ovale) or other heart problems.  What are the signs or symptoms? The main symptom of this condition is pulsating or throbbing pain. Pain may:  Happen in any area of the head, such as on one side or both sides.  Interfere with daily activities.  Get worse with physical activity.  Get worse with exposure to bright lights or loud noises.  Other symptoms may include:  Nausea.  Vomiting.  Dizziness.  General sensitivity to bright lights, loud noises, or smells.  Before you get a migraine, you may get warning signs that a migraine is developing (aura). An aura may include:  Seeing flashing lights or having blind spots.  Seeing bright spots, halos, or zigzag lines.  Having tunnel vision or blurred vision.  Having numbness or a tingling feeling.  Having trouble  talking.  Having muscle weakness.  How is this diagnosed? A migraine headache can be diagnosed based on:  Your symptoms.  A physical exam.  Tests, such as CT scan or MRI of the head. These imaging tests can help rule out other causes of headaches.  Taking fluid from the spine (lumbar puncture) and analyzing it (cerebrospinal fluid analysis, or CSF analysis).  How is this treated? A migraine headache is usually treated with medicines that:  Relieve pain.  Relieve nausea.  Prevent migraines from coming back.  Treatment may also include:  Acupuncture.  Lifestyle changes like avoiding foods that trigger migraines.  Follow these instructions at home: Medicines  Take over-the-counter and prescription medicines only as told by your health care provider.  Do not drive or use heavy machinery while taking prescription pain medicine.  To prevent or treat constipation while you are taking prescription pain medicine, your health care provider may recommend that you: ? Drink enough fluid to keep your urine clear or pale yellow. ? Take over-the-counter or prescription medicines. ? Eat foods that are high in fiber, such as fresh fruits and vegetables, whole grains, and beans. ? Limit foods that are high in fat and processed sugars, such as fried and sweet foods. Lifestyle  Avoid alcohol use.  Do not use any products that contain nicotine or tobacco, such as cigarettes and e-cigarettes. If you need help quitting, ask your health care provider.  Get at least 8 hours of sleep every night.  Limit your stress. General instructions   Keep a journal to find out what may trigger your  migraine headaches. For example, write down: ? What you eat and drink. ? How much sleep you get. ? Any change to your diet or medicines.  If you have a migraine: ? Avoid things that make your symptoms worse, such as bright lights. ? It may help to lie down in a dark, quiet room. ? Do not drive or  use heavy machinery. ? Ask your health care provider what activities are safe for you while you are experiencing symptoms.  Keep all follow-up visits as told by your health care provider. This is important. Contact a health care provider if:  You develop symptoms that are different or more severe than your usual migraine symptoms. Get help right away if:  Your migraine becomes severe.  You have a fever.  You have a stiff neck.  You have vision loss.  Your muscles feel weak or like you cannot control them.  You start to lose your balance often.  You develop trouble walking.  You faint. This information is not intended to replace advice given to you by your health care provider. Make sure you discuss any questions you have with your health care provider. Document Released: 10/16/2005 Document Revised: 05/05/2016 Document Reviewed: 04/03/2016 Elsevier Interactive Patient Education  2017 [employer].

## 2018-08-12 NOTE — Progress Notes (Signed)
[facility]    Provider:  Dr Lucia Gaskins Referring Provider: Neta Mends, CNM Primary Care Physician:  Neta Mends, CNM  CC:  "suffering from chronic migraines since a child"  HPI:  Patricia Butler is a 22 y.o. female here as requested by Dr. Renae Fickle for migraines. She is 6 months pregnant and in the last month she is improved. No Fhx of migraines but they started as a child. Migraines start on one ide of the hea, eye pain, pounding, throbbing, blurry vision, nothing helps except a dark room and sleep, +nausea, movement makes it worse. Before pregnancy they would last 24 hours but sleep w0uld help and the next day would feel better. +photo/phonophobia. They could be severe. Stress makes it worse, she has had vision changes and slurred speech. She could wake up with migraines with neck pain. Too little sleep can be a trigger. Worse in first trimester but now getting better in 2nd and 3rd trimester. She has only had one headache in the last month not migrainous. She does not want any medication at this time. No other focal neurologic deficits, associated symptoms, inciting events or modifiable factors. Prior she was having 20 migraine days a month.    Reviewed notes, labs and imaging from outside physicians, which showed:  CBC 04/2018 with platelets 121   Review of Systems: Patient complains of symptoms per HPI as well as the following symptoms: headaches, weakness, dizziness. Pertinent negatives and positives per HPI. All others negative.   Social History   Socioeconomic History  . Marital status: Single    Spouse name: Not on file  . Number of children: 1  . Years of education: Not on file  . Highest education level: High school graduate  Occupational History  . Not on file  Social Needs  . Financial resource strain: Not on file  . Food insecurity:    Worry: Not on file    Inability: Not on file  . Transportation needs:    Medical: Not on file    Non-medical:  Not on file  Tobacco Use  . Smoking status: Former Smoker    Types: Cigars    Last attempt to quit: 01/2018    Years since quitting: 0.5  . Smokeless tobacco: Never Used  Substance and Sexual Activity  . Alcohol use: Never    Frequency: Never  . Drug use: Never  . Sexual activity: Yes  Lifestyle  . Physical activity:    Days per week: Not on file    Minutes per session: Not on file  . Stress: Not on file  Relationships  . Social connections:    Talks on phone: Not on file    Gets together: Not on file    Attends religious service: Not on file    Active member of club or organization: Not on file    Attends meetings of clubs or organizations: Not on file    Relationship status: Not on file  . Intimate partner violence:    Fear of current or ex partner: Not on file    Emotionally abused: Not on file    Physically abused: Not on file    Forced sexual activity: Not on file  Other Topics Concern  . Not on file  Social History Narrative   Lives at home with her boyfriend and son   Right handed   Caffeine: sodas, quit August 2019    Family History  Problem Relation Age of Onset  . Diabetes  Other   . Migraines Neg Hx     Past Medical History:  Diagnosis Date  . Anemia    pt unaware of this  . Chlamydia   . Migraine     Past Surgical History:  Procedure Laterality Date  . WISDOM TOOTH EXTRACTION      Current Outpatient Medications  Medication Sig Dispense Refill  . Cranberry-Vitamin C-Probiotic (AZO CRANBERRY) 250-30 MG TABS Take 2 tablets by mouth daily.    . Prenatal Vit-Fe Fumarate-FA (PRENATAL VITAMIN PO) Take by mouth.    . Probiotic Product (PROBIOTIC PO) Take 1 capsule by mouth daily.    . polyethylene glycol powder (MIRALAX) powder Take 17 g by mouth daily as needed. (Patient not taking: Reported on 04/10/2018) 255 g 0   No current facility-administered medications for this visit.     Allergies as of 08/12/2018  . (No Known Allergies)     Vitals: BP 100/61 (BP Location: Right Arm, Patient Position: Sitting)   Pulse 80   Ht 5\' 3"  (1.6 m)   Wt 157 lb (71.2 kg)   LMP 02/26/2018   BMI 27.81 kg/m  Last Weight:  Wt Readings from Last 1 Encounters:  08/12/18 157 lb (71.2 kg)   Last Height:   Ht Readings from Last 1 Encounters:  08/12/18 5\' 3"  (1.6 m)    Physical exam: Exam: Gen: NAD, conversant, well nourised, pregnant, well groomed                     CV: RRR, no MRG. No Carotid Bruits. No peripheral edema, warm, nontender Eyes: Conjunctivae clear without exudates or hemorrhage  Neuro: Detailed Neurologic Exam  Speech:    Speech is normal; fluent and spontaneous with normal comprehension.  Cognition:    The patient is oriented to person, place, and time;     recent and remote memory intact;     language fluent;     normal attention, concentration,     fund of knowledge Cranial Nerves:    The pupils are equal, round, and reactive to light. The fundi are normal and spontaneous venous pulsations are present. Visual fields are full to finger confrontation. Extraocular movements are intact. Trigeminal sensation is intact and the muscles of mastication are normal. The face is symmetric. The palate elevates in the midline. Hearing intact. Voice is normal. Shoulder shrug is normal. The tongue has normal motion without fasciculations.   Coordination:    Normal finger to nose and heel to shin. Normal rapid alternating movements.   Gait:    Heel-toe and tandem gait are normal.   Motor Observation:    No asymmetry, no atrophy, and no involuntary movements noted. Tone:    Normal muscle tone.    Posture:    Posture is normal. normal erect    Strength:    Strength is V/V in the upper and lower limbs.      Sensation: intact to LT     Reflex Exam:  DTR's:    Deep tendon reflexes in the upper and lower extremities are normal bilaterally.   Toes:    The toes are downgoing bilaterally.   Clonus:    Clonus is  absent.      Assessment/Plan:  22 year old with migraine without aura  - improved in the last month, 80% of migraineurs improve in 2nd and 3rd trimesters. Will follow clinically.  - She will make a list of medications she has used in the past,  - Tylenol  as needed for headache or non-pharmalogical treatments - Consider MRI of the brain in the future  Discussed: To prevent or relieve headaches, try the following: Cool Compress. Lie down and place a cool compress on your head.  Avoid headache triggers. If certain foods or odors seem to have triggered your migraines in the past, avoid them. A headache diary might help you identify triggers.  Include physical activity in your daily routine. Try a daily walk or other moderate aerobic exercise.  Manage stress. Find healthy ways to cope with the stressors, such as delegating tasks on your to-do list.  Practice relaxation techniques. Try deep breathing, yoga, massage and visualization.  Eat regularly. Eating regularly scheduled meals and maintaining a healthy diet might help prevent headaches. Also, drink plenty of fluids.  Follow a regular sleep schedule. Sleep deprivation might contribute to headaches Consider biofeedback. With this mind-body technique, you learn to control certain bodily functions - such as muscle tension, heart rate and blood pressure - to prevent headaches or reduce headache pain.    Proceed to emergency room if you experience new or worsening symptoms or symptoms do not resolve, if you have new neurologic symptoms or if headache is severe, or for any concerning symptom.   Provided education and documentation from American headache Society toolbox including articles on: chronic migraine medication overuse headache, chronic migraines, prevention of migraines, behavioral and other nonpharmacologic treatments for headache.  A total of 60 minutes was spent face-to-face with this patient. Over half this time was spent on  counseling patient on the  1. Chronic migraine without aura without status migrainosus, not intractable     diagnosis and different diagnostic and therapeutic options, counseling and coordination of care, risks ans benefits of management, compliance, or risk factor reduction and education.      Naomie Dean, MD  [facility] [address] [place], [place] [address]  Phone [phone] Fax [phone]

## 2018-08-23 ENCOUNTER — Encounter (HOSPITAL_COMMUNITY): Payer: Self-pay

## 2018-08-23 ENCOUNTER — Other Ambulatory Visit: Payer: Self-pay

## 2018-08-23 ENCOUNTER — Inpatient Hospital Stay (HOSPITAL_COMMUNITY)
Admission: AD | Admit: 2018-08-23 | Discharge: 2018-08-23 | Disposition: A | Discharge status: Home / Self Care | Payer: Medicaid Other | Source: Ambulatory Visit | Attending: Obstetrics & Gynecology | Admitting: Obstetrics & Gynecology

## 2018-08-23 DIAGNOSIS — N898 Other specified noninflammatory disorders of vagina: Secondary | ICD-10-CM | POA: Diagnosis not present

## 2018-08-23 DIAGNOSIS — Z3A25 25 weeks gestation of pregnancy: Secondary | ICD-10-CM | POA: Diagnosis not present

## 2018-08-23 DIAGNOSIS — O26892 Other specified pregnancy related conditions, second trimester: Secondary | ICD-10-CM | POA: Diagnosis not present

## 2018-08-23 DIAGNOSIS — Z87891 Personal history of nicotine dependence: Secondary | ICD-10-CM | POA: Insufficient documentation

## 2018-08-23 LAB — URINALYSIS, ROUTINE W REFLEX MICROSCOPIC
BACTERIA UA: NONE SEEN
Bilirubin Urine: NEGATIVE
GLUCOSE, UA: NEGATIVE mg/dL
Hgb urine dipstick: NEGATIVE
Ketones, ur: NEGATIVE mg/dL
Leukocytes, UA: NEGATIVE
Nitrite: NEGATIVE
Protein, ur: NEGATIVE mg/dL
SPECIFIC GRAVITY, URINE: 1.011 (ref 1.005–1.030)
pH: 7 (ref 5.0–8.0)

## 2018-08-23 NOTE — MAU Note (Signed)
Called the nurse line, was told to come in and get checked.  When went to the restroom, had some thick mucous.  Having abd  Pressure and pressure in her bottom.

## 2018-08-23 NOTE — Discharge Instructions (Signed)

## 2018-08-23 NOTE — MAU Provider Note (Signed)
Chief Complaint:  Abdominal Pain and Vaginal Discharge   First Provider Initiated Contact with Patient 08/23/18 1923     HPI: Patricia Butler is a 22 y.o. G3P1011 at [redacted]w[redacted]d who presents to maternity admissions reporting vaginal discharge. Called hospital nurse line reports thick mucoid discharge & was instructed to come to MAU for evaluation. Goes to [facility] for prenatal care.  Noticed discharge today. States clear and thick without odor. Denies vaginal itching or irritation. Had intercourse 2 nights ago. No vaginal bleeding or LOF. Has been feeling some pelvic pressure but denies abdominal pain or contractions. Positive fetal movement.    Past Medical History:  Diagnosis Date  . Anemia    pt unaware of this  . Chlamydia   . Migraine    OB History  Gravida Para Term Preterm AB Living  3 1 1   1 1   SAB TAB Ectopic Multiple Live Births  1     0 1    # Outcome Date GA Lbr Len/2nd Weight Sex Delivery Anes PTL Lv  3 Current           2 Term 02/01/17 [redacted]w[redacted]d 21:27 / 00:44 3544 g M Vag-Spont Local  LIV  1 SAB            Past Surgical History:  Procedure Laterality Date  . WISDOM TOOTH EXTRACTION     Family History  Problem Relation Age of Onset  . Diabetes Other   . Migraines Neg Hx    Social History   Tobacco Use  . Smoking status: Former Smoker    Types: Cigars    Last attempt to quit: 01/2018    Years since quitting: 0.5  . Smokeless tobacco: Never Used  Substance Use Topics  . Alcohol use: Never    Frequency: Never  . Drug use: Never   No Known Allergies No medications prior to admission.    I have reviewed patient's Past Medical Hx, Surgical Hx, Family Hx, Social Hx, medications and allergies.   ROS:  Review of Systems  Constitutional: Negative.   Gastrointestinal: Negative.   Genitourinary: Positive for vaginal discharge. Negative for vaginal bleeding.    Physical Exam   Patient Vitals for the past 24 hrs:  BP Temp Temp src Pulse Resp SpO2  Weight  08/23/18 1957 98/60 - - - - - -  08/23/18 1838 109/70 98.4 F (36.9 C) Oral 72 18 100 % 71.2 kg    Constitutional: Well-developed, well-nourished female in no acute distress.  Cardiovascular: normal rate & rhythm, no murmur Respiratory: normal effort, lung sounds clear throughout GI: Abd soft, non-tender, gravid appropriate for gestational age. Pos BS x 4 MS: Extremities nontender, no edema, normal ROM Neurologic: Alert and oriented x 4.  GU:      Pelvic: NEFG, physiologic discharge, no blood, cervix clean.   Dilation: Closed Effacement (%): Thick Cervical Position: Posterior Exam by:: Lyman Bishop, NP  NST:  Baseline: 145 bpm, Variability: Good {> 6 bpm), Accelerations: Non-reactive but appropriate for gestational age and Decelerations: Absent   Labs: Results for orders placed or performed during the hospital encounter of 08/23/18 (from the past 24 hour(s))  Urinalysis, Routine w reflex microscopic     Status: None   Collection Time: 08/23/18  6:42 PM  Result Value Ref Range   Color, Urine YELLOW YELLOW   APPearance CLEAR CLEAR   Specific Gravity, Urine 1.011 1.005 - 1.030   pH 7.0 5.0 - 8.0   Glucose, UA NEGATIVE NEGATIVE  mg/dL   Hgb urine dipstick NEGATIVE NEGATIVE   Bilirubin Urine NEGATIVE NEGATIVE   Ketones, ur NEGATIVE NEGATIVE mg/dL   Protein, ur NEGATIVE NEGATIVE mg/dL   Nitrite NEGATIVE NEGATIVE   Leukocytes, UA NEGATIVE NEGATIVE   RBC / HPF 0-5 0 - 5 RBC/hpf   WBC, UA 0-5 0 - 5 WBC/hpf   Bacteria, UA NONE SEEN NONE SEEN   Squamous Epithelial / LPF 0-5 0 - 5   Mucus PRESENT     Imaging:  No results found.  MAU Course: Orders Placed This Encounter  Procedures  . Urinalysis, Routine w reflex microscopic  . Discharge patient   No orders of the defined types were placed in this encounter.   MDM: Abdomen soft & nontender. Cervix closed/thick Normal discharge on exam. Pt reassured Colon Flattery, CNM aware of patient's MAU visit & requests pt to be seen in  office next week  Assessment: 1. Vaginal discharge during pregnancy in second trimester   2. [redacted] weeks gestation of pregnancy     Plan: Discharge home in stable condition.  Preterm Labor precautions and fetal kick counts Follow-up Information    Neta Mends, CNM Follow up.   Specialty:  Obstetrics and Gynecology Why:  Call to be seen in the office next week Contact information: [address] [place] [address] [phone]           Allergies as of 08/23/2018   No Known Allergies     Medication List    TAKE these medications   AZO CRANBERRY 250-30 MG Tabs Take 2 tablets by mouth daily.   polyethylene glycol powder powder Commonly known as:  GLYCOLAX/MIRALAX Take 17 g by mouth daily as needed.   PRENATAL VITAMIN PO Take by mouth.   PROBIOTIC PO Take 1 capsule by mouth daily.       Judeth Horn, NP 08/23/2018 11:20 PM

## 2018-09-04 ENCOUNTER — Other Ambulatory Visit: Payer: Self-pay

## 2018-09-04 ENCOUNTER — Encounter (HOSPITAL_COMMUNITY): Payer: Self-pay

## 2018-09-04 ENCOUNTER — Emergency Department (HOSPITAL_COMMUNITY)
Admission: EM | Admit: 2018-09-04 | Discharge: 2018-09-04 | Disposition: A | Discharge status: Home / Self Care | Payer: Medicaid Other | Attending: Emergency Medicine | Admitting: Emergency Medicine

## 2018-09-04 DIAGNOSIS — Z79899 Other long term (current) drug therapy: Secondary | ICD-10-CM | POA: Insufficient documentation

## 2018-09-04 DIAGNOSIS — O99512 Diseases of the respiratory system complicating pregnancy, second trimester: Secondary | ICD-10-CM | POA: Diagnosis not present

## 2018-09-04 DIAGNOSIS — Z87891 Personal history of nicotine dependence: Secondary | ICD-10-CM | POA: Diagnosis not present

## 2018-09-04 DIAGNOSIS — O98512 Other viral diseases complicating pregnancy, second trimester: Secondary | ICD-10-CM | POA: Insufficient documentation

## 2018-09-04 DIAGNOSIS — J069 Acute upper respiratory infection, unspecified: Secondary | ICD-10-CM | POA: Insufficient documentation

## 2018-09-04 DIAGNOSIS — Z3A27 27 weeks gestation of pregnancy: Secondary | ICD-10-CM | POA: Insufficient documentation

## 2018-09-04 DIAGNOSIS — Z3490 Encounter for supervision of normal pregnancy, unspecified, unspecified trimester: Secondary | ICD-10-CM

## 2018-09-04 DIAGNOSIS — O9989 Other specified diseases and conditions complicating pregnancy, childbirth and the puerperium: Secondary | ICD-10-CM | POA: Diagnosis present

## 2018-09-04 LAB — CBC WITH DIFFERENTIAL/PLATELET
Abs Immature Granulocytes: 0.41 10*3/uL — ABNORMAL HIGH (ref 0.00–0.07)
BASOS PCT: 0 %
Basophils Absolute: 0 10*3/uL (ref 0.0–0.1)
EOS PCT: 1 %
Eosinophils Absolute: 0 10*3/uL (ref 0.0–0.5)
HEMATOCRIT: 30.2 % — AB (ref 36.0–46.0)
Hemoglobin: 10.1 g/dL — ABNORMAL LOW (ref 12.0–15.0)
IMMATURE GRANULOCYTES: 5 %
LYMPHS ABS: 1.3 10*3/uL (ref 0.7–4.0)
Lymphocytes Relative: 17 %
MCH: 32.5 pg (ref 26.0–34.0)
MCHC: 33.4 g/dL (ref 30.0–36.0)
MCV: 97.1 fL (ref 80.0–100.0)
MONOS PCT: 10 %
Monocytes Absolute: 0.8 10*3/uL (ref 0.1–1.0)
NEUTROS PCT: 67 %
NRBC: 0 % (ref 0.0–0.2)
Neutro Abs: 5.2 10*3/uL (ref 1.7–7.7)
PLATELETS: 101 10*3/uL — AB (ref 150–400)
RBC: 3.11 MIL/uL — ABNORMAL LOW (ref 3.87–5.11)
RDW: 13.7 % (ref 11.5–15.5)
WBC: 7.8 10*3/uL (ref 4.0–10.5)

## 2018-09-04 LAB — COMPREHENSIVE METABOLIC PANEL
ALBUMIN: 3.1 g/dL — AB (ref 3.5–5.0)
ALK PHOS: 58 U/L (ref 38–126)
ALT: 9 U/L (ref 0–44)
ANION GAP: 8 (ref 5–15)
AST: 13 U/L — ABNORMAL LOW (ref 15–41)
BILIRUBIN TOTAL: 0.6 mg/dL (ref 0.3–1.2)
BUN: 5 mg/dL — ABNORMAL LOW (ref 6–20)
CALCIUM: 8.5 mg/dL — AB (ref 8.9–10.3)
CO2: 23 mmol/L (ref 22–32)
CREATININE: 0.49 mg/dL (ref 0.44–1.00)
Chloride: 106 mmol/L (ref 98–111)
Glucose, Bld: 86 mg/dL (ref 70–99)
Potassium: 3.7 mmol/L (ref 3.5–5.1)
Sodium: 137 mmol/L (ref 135–145)
TOTAL PROTEIN: 6.3 g/dL — AB (ref 6.5–8.1)

## 2018-09-04 LAB — INFLUENZA PANEL BY PCR (TYPE A & B)
INFLBPCR: NEGATIVE
Influenza A By PCR: NEGATIVE

## 2018-09-04 LAB — GROUP A STREP BY PCR: Group A Strep by PCR: NOT DETECTED

## 2018-09-04 NOTE — Discharge Instructions (Addendum)
Try and keep yourself hydrated.  Benadryl may help with the symptoms.

## 2018-09-04 NOTE — ED Notes (Signed)
Pt waiting for lab results 

## 2018-09-04 NOTE — ED Triage Notes (Signed)
Pt states she has been having a sore throat during the day, and has been having SHOB when she is asleep, waking her up. Pt states she has been having nose bleeds, as well as mild migraines. Pt is [redacted] weeks pregnant. Pt states that she has been sleeping on her side.

## 2018-09-04 NOTE — ED Provider Notes (Signed)
[facility]-EMERGENCY DEPT Provider Note   CSN: [id number] Arrival date & time: 09/04/18  0907     History   Chief Complaint Chief Complaint  Patient presents with  . Influenza    HPI Patricia Butler is a 22 y.o. female.  HPI Patient is around [redacted] weeks pregnant.  For the last week she has had sore throat chest congestion cough.  Some shortness of breath.  Also states she has had a dull right-sided headache and some nosebleeds.  States had his congestion.  States she is never had a nosebleed before.  States she has been told she has a low platelets and anemia in the past.  States that was with her last pregnancy but has not seen anyone besides that episode.  No fevers.  Still feels baby moving.  No abdominal pain.  No swelling of her legs.  States she has a son that has similar symptoms with a cough and congestion. Past Medical History:  Diagnosis Date  . Anemia    pt unaware of this  . Chlamydia   . Migraine     Patient Active Problem List   Diagnosis Date Noted  . Chronic migraine without aura without status migrainosus, not intractable 08/12/2018  . PROM (premature rupture of membranes) 01/31/2017  . Thrombocytopenia (HCC) 01/31/2017    Past Surgical History:  Procedure Laterality Date  . WISDOM TOOTH EXTRACTION       OB History    Gravida  3   Para  1   Term  1   Preterm      AB  1   Living  1     SAB  1   TAB      Ectopic      Multiple  0   Live Births  1            Home Medications    Prior to Admission medications   Medication Sig Start Date End Date Taking? Authorizing Provider  Cranberry-Vitamin C-Probiotic (AZO CRANBERRY) 250-30 MG TABS Take 2 tablets by mouth daily.   Yes [provider]  dextromethorphan-guaiFENesin (TUSSIN DM) 10-100 MG/5ML liquid Take 10 mLs by mouth every 4 (four) hours as needed for cough.   Yes [provider]  Prenatal Vit-Fe Fumarate-FA (PRENATAL VITAMIN PO) Take by  mouth.   Yes [provider]  Probiotic Product (PROBIOTIC PO) Take 1 capsule by mouth daily.   Yes [provider]  polyethylene glycol powder (MIRALAX) powder Take 17 g by mouth daily as needed. Patient not taking: Reported on 04/10/2018 11/20/17   Georgetta Haber, NP    Family History Family History  Problem Relation Age of Onset  . Diabetes Other   . Migraines Neg Hx     Social History Social History   Tobacco Use  . Smoking status: Former Smoker    Types: Cigars    Last attempt to quit: 01/2018    Years since quitting: 0.6  . Smokeless tobacco: Never Used  Substance Use Topics  . Alcohol use: Never    Frequency: Never  . Drug use: Never     Allergies   Patient has no known allergies.   Review of Systems Review of Systems  Constitutional: Negative for appetite change.  HENT: Negative for congestion.   Respiratory: Positive for cough and shortness of breath.   Cardiovascular: Negative for chest pain.  Gastrointestinal: Negative for abdominal pain and vomiting.  Genitourinary: Negative for enuresis and flank pain.  Musculoskeletal: Negative for back pain.  Skin: Negative for rash.  Neurological: Negative for weakness.  Psychiatric/Behavioral: Negative for confusion.     Physical Exam Updated Vital Signs BP 107/69   Pulse 82   Temp 98.6 F (37 C) (Oral)   Resp 18   Ht 5\' 3"  (1.6 m)   Wt 71.2 kg   LMP 02/26/2018   SpO2 100%   BMI 27.81 kg/m   Physical Exam  Constitutional: She appears well-developed.  HENT:  Head: Normocephalic.  Mild posterior pharyngeal erythema  Eyes: Pupils are equal, round, and reactive to light.  Neck: Neck supple.  Cardiovascular: Normal rate.  Pulmonary/Chest: She has no wheezes. She has no rales.  Abdominal: She exhibits mass.  Musculoskeletal: She exhibits no edema.  Neurological: She is alert.  Skin: Skin is warm. Capillary refill takes less than 2 seconds.  Psychiatric: She has a normal mood and  affect.     ED Treatments / Results  Labs (all labs ordered are listed, but only abnormal results are displayed) Labs Reviewed  COMPREHENSIVE METABOLIC PANEL - Abnormal; Notable for the following components:      Result Value   BUN 5 (*)    Calcium 8.5 (*)    Total Protein 6.3 (*)    Albumin 3.1 (*)    AST 13 (*)    All other components within normal limits  CBC WITH DIFFERENTIAL/PLATELET - Abnormal; Notable for the following components:   RBC 3.11 (*)    Hemoglobin 10.1 (*)    HCT 30.2 (*)    Platelets 101 (*)    Abs Immature Granulocytes 0.41 (*)    All other components within normal limits  GROUP A STREP BY PCR  INFLUENZA PANEL BY PCR (TYPE A & B)    EKG None  Radiology No results found.  Procedures Procedures (including critical care time)  Medications Ordered in ED Medications - No data to display   Initial Impression / Assessment and Plan / ED Course  I have reviewed the triage vital signs and the nursing notes.  Pertinent labs & imaging results that were available during my care of the patient were reviewed by me and considered in my medical decision making (see chart for details).     Patient with pregnancy.  Also URI symptoms.  Negative strep and negative flu.  Lungs are clear on clinical exam.  Discharged home with symptomatic treatment.  Benadryl as needed.  Final Clinical Impressions(s) / ED Diagnoses   Final diagnoses:  Upper respiratory tract infection, unspecified type  Pregnancy, unspecified gestational age    ED Discharge Orders    None       Benjiman Core, MD 09/04/18 1610

## 2018-09-05 ENCOUNTER — Inpatient Hospital Stay (HOSPITAL_COMMUNITY)
Admission: AD | Admit: 2018-09-05 | Discharge: 2018-09-05 | Disposition: A | Discharge status: Home / Self Care | Payer: Medicaid Other | Source: Ambulatory Visit | Attending: Obstetrics and Gynecology | Admitting: Obstetrics and Gynecology

## 2018-09-05 ENCOUNTER — Encounter (HOSPITAL_COMMUNITY): Payer: Self-pay

## 2018-09-05 ENCOUNTER — Other Ambulatory Visit: Payer: Self-pay

## 2018-09-05 DIAGNOSIS — R0981 Nasal congestion: Secondary | ICD-10-CM | POA: Diagnosis present

## 2018-09-05 DIAGNOSIS — Z87891 Personal history of nicotine dependence: Secondary | ICD-10-CM | POA: Diagnosis not present

## 2018-09-05 DIAGNOSIS — J019 Acute sinusitis, unspecified: Secondary | ICD-10-CM | POA: Insufficient documentation

## 2018-09-05 LAB — URINALYSIS, ROUTINE W REFLEX MICROSCOPIC
Bilirubin Urine: NEGATIVE
Glucose, UA: NEGATIVE mg/dL
Hgb urine dipstick: NEGATIVE
Ketones, ur: NEGATIVE mg/dL
Nitrite: NEGATIVE
Protein, ur: NEGATIVE mg/dL
Specific Gravity, Urine: 1.008 (ref 1.005–1.030)
pH: 7 (ref 5.0–8.0)

## 2018-09-05 MED ORDER — AMOXICILLIN-POT CLAVULANATE 500-125 MG PO TABS: 1.0000 | ORAL_TABLET | Freq: Three times a day (TID) | ORAL | 0 refills | Status: AC

## 2018-09-05 MED ORDER — BENZONATATE 100 MG PO CAPS: 100.0000 mg | ORAL_CAPSULE | Freq: Three times a day (TID) | ORAL | 0 refills | Status: DC

## 2018-09-05 NOTE — MAU Note (Addendum)
Pt states that Thursday last week she started having a sore throat.   Pt states she went to [facility] hospital yesterday morning because she was having nose bleeds.   Pt states they did blood work, flu swab, and strep swab yesterday and they were all negative.  The only thing they didn't do was an x-ray because she is pregnant. She is wanting an x-ray today.   Pt states the last 3-4 days she has had bad tooth pain. She states she made an appointment to be seen for that tomorrow.  However, pt reports excruciating pain when looking down.   Pt reports migraine for last 4 days.   Pt reports feeling baby move all day but has not felt baby move in the last hour.   Pt reports fever 101.5 earlier today. She did not take anything for it.

## 2018-09-05 NOTE — MAU Note (Addendum)
PT SAYS SHE FEELS CONGESTED  - WENT  TO [facility] YESTERDAY-  LABS, SWABS-  , NO MEDS.   SAYS  DID NOT CALL DANIELLA, CNM     THROAT SORE - , RIGHT EAR HURTS- AND FACE.

## 2018-09-05 NOTE — Progress Notes (Signed)
Per provider Dr. Vear Clock, pt does not need an NST.

## 2018-09-06 NOTE — MAU Provider Note (Signed)
History     CSN: [id number]  Arrival date and time: 09/05/18 1850   None     Chief Complaint  Patient presents with  . Nasal Congestion   [name] presents for a 1 week history of fever, sore throat, cough, sinus congestion, facial pain, nose bleeds, headache, shortness of breath and chest pain. She states that symptoms have been the same or worse despite treatment at home with over the counter medications. She went to [facility] where a series of tests were negative. She was sent home, but states that her brother who was seen with her was diagnosed with pneumonia. She's concerned that she also has pneumonia, but did not get a chest Xray therefore was not diagnosed. She reports that she never has respiratory infections, but can't sleep or function due to all of her symptoms.      Past Medical History:  Diagnosis Date  . Anemia    pt unaware of this  . Chlamydia   . Migraine     Past Surgical History:  Procedure Laterality Date  . WISDOM TOOTH EXTRACTION      Family History  Problem Relation Age of Onset  . Diabetes Other   . Migraines Neg Hx     Social History   Tobacco Use  . Smoking status: Former Smoker    Types: Cigars    Last attempt to quit: 01/2018    Years since quitting: 0.6  . Smokeless tobacco: Never Used  Substance Use Topics  . Alcohol use: Never    Frequency: Never  . Drug use: Never    Allergies: No Known Allergies  No medications prior to admission.    Review of Systems  Constitutional: Positive for activity change (decreased), chills, fatigue and fever.  HENT: Positive for congestion, ear pain, rhinorrhea, sinus pressure, sinus pain and sore throat.   Eyes: Positive for discharge and redness.  Respiratory: Positive for cough and shortness of breath.   Cardiovascular: Positive for chest pain.  Gastrointestinal: Negative for abdominal pain, constipation, diarrhea, nausea and vomiting.  Genitourinary: Negative for difficulty  urinating, dysuria, urgency, vaginal bleeding and vaginal discharge.  Neurological: Positive for headaches. Negative for dizziness and light-headedness.   Physical Exam   Blood pressure 111/63, pulse 92, temperature 99.5 F (37.5 C), resp. rate 12, weight 72.5 kg, last menstrual period 02/26/2018, SpO2 95 %, unknown if currently breastfeeding.  Physical Exam  Constitutional: She is oriented to person, place, and time. She appears well-developed and well-nourished. No distress.  HENT:  Head: Normocephalic and atraumatic.  Right Ear: External ear normal.  Left Ear: External ear normal.  Nose: Nose normal.  Mouth/Throat: Oropharynx is clear and moist. No oropharyngeal exudate.  Mild erythema in oropharynx  Eyes: Pupils are equal, round, and reactive to light. Conjunctivae and EOM are normal. Right eye exhibits no discharge. Left eye exhibits no discharge. No scleral icterus.  Neck: Normal range of motion. Neck supple. No thyromegaly present.  Cardiovascular: Normal rate, regular rhythm and normal heart sounds.  No murmur heard. Respiratory: Effort normal and breath sounds normal. No stridor. No respiratory distress. She has no wheezes. She has no rales.  GI: There is no tenderness.  gravid  Lymphadenopathy:    She has cervical adenopathy (mild shotty lymphadenopathy ).  Neurological: She is alert and oriented to person, place, and time. No cranial nerve deficit.  Skin: Skin is warm and dry. No rash noted.  Psychiatric: She has a normal mood and affect. Her behavior  is normal. Judgment and thought content normal.    MAU Course  Procedures  MDM Patient with symptoms of URI, however also has sinus pain and congestion Exam mostly unremarkable, but given symptoms, will treat for acute sinusitis   Assessment and Plan  Acute non-recurrent sinusitis, unspecified location - Plan: Discharge patient - will prescribe short course of augmentin - encourage hydration - encourage sinus rinse  BID - tessalon PRN - return precautions discussed - follow up as needed    Patricia Butler 09/06/2018, 12:09 AM

## 2018-09-07 LAB — CULTURE, OB URINE: Culture: >=100000 — AB

## 2018-11-19 ENCOUNTER — Other Ambulatory Visit (HOSPITAL_COMMUNITY): Payer: Self-pay | Admitting: *Deleted

## 2018-11-19 ENCOUNTER — Telehealth: Payer: Self-pay | Admitting: Student

## 2018-11-19 NOTE — Telephone Encounter (Signed)
CMA Corinda Gubler received call from [facility] asking for Feraheme infusion for this patient; stating it was ordered by Thressa Sheller. Ms. Jennette Kettle approached me for assistance. Upon chart review, this patient is not a patient of [facility]. Called Ms. Laurita and informed her that her provider should contact [facility] to schedule her  Feraheme order, and that she should check with [facility] to get it  resolved. At the same time, Pam spoke with [facility] who had thus realized their mistake and was planning to call [facility] for a feraheme order.   Patient thanked me for the phone call; plans to reach out to [place] today.   Luna Kitchens

## 2018-11-19 NOTE — Progress Notes (Signed)
Pt is not seen by Thressa Sheller but rather Marlinda Mike at [facility].  They were called and will fax order for tomorrow's infusion.

## 2018-11-19 NOTE — Progress Notes (Signed)
Spoke with Pam at the office of Thressa Sheller, and requested orders for tomorrows infusion.

## 2018-11-20 ENCOUNTER — Encounter (HOSPITAL_COMMUNITY)
Admission: RE | Admit: 2018-11-20 | Discharge: 2018-11-20 | Disposition: A | Discharge status: Home / Self Care | Payer: Medicaid Other | Source: Ambulatory Visit | Attending: Certified Nurse Midwife | Admitting: Certified Nurse Midwife

## 2018-11-20 DIAGNOSIS — Z3A00 Weeks of gestation of pregnancy not specified: Secondary | ICD-10-CM | POA: Insufficient documentation

## 2018-11-20 DIAGNOSIS — O99019 Anemia complicating pregnancy, unspecified trimester: Secondary | ICD-10-CM | POA: Insufficient documentation

## 2018-11-20 MED ORDER — SODIUM CHLORIDE 0.9 % IV SOLN
510.0000 mg | INTRAVENOUS | Status: DC
  Administered 2018-11-20: 510 mg via INTRAVENOUS
  Filled 2018-11-20: qty 17

## 2018-11-20 NOTE — Discharge Instructions (Signed)

## 2018-11-27 ENCOUNTER — Inpatient Hospital Stay (HOSPITAL_COMMUNITY): Admission: RE | Admit: 2018-11-27 | Payer: Medicaid Other | Source: Ambulatory Visit

## 2019-02-11 ENCOUNTER — Ambulatory Visit: Payer: Self-pay | Admitting: Neurology

## 2019-02-25 ENCOUNTER — Encounter (HOSPITAL_COMMUNITY): Payer: Self-pay

## 2019-04-17 ENCOUNTER — Encounter: Payer: Self-pay | Admitting: *Deleted

## 2019-04-17 ENCOUNTER — Telehealth: Payer: Self-pay | Admitting: *Deleted

## 2019-04-17 NOTE — Telephone Encounter (Signed)
Spoke with pt and discussed 6/23 appt. d/t covid 19 pandemic,our office is severely reducing in person visits for at least the next two week, in order to minimize the risk to our patients and healthcare providers. Advised that we recommend patient convert to a telemedicine visit. Although there may be some limitations with this type of visit, we will take all precautions to reduce any security or privacy concerns. This will be treated like an in-office visit and we will file with your insurance, and there may be a patient responsible charge related to this service. Patient verbalized understanding to the above and agreed to a doxy visit. She is going to be out of town this weekend and wouldn't be able to setup [employer]. She understands the link will be sent to her. Pt will use her cell for the VV. She confirmed her cell #. She also confirmed dob and provided updates to chart. She verbalized appreciation for the call.   Doxy link sent to pt's cell phone.

## 2019-04-22 ENCOUNTER — Ambulatory Visit: Payer: Medicaid Other | Admitting: Neurology

## 2019-04-22 ENCOUNTER — Other Ambulatory Visit: Payer: Self-pay

## 2019-04-22 ENCOUNTER — Telehealth: Payer: Self-pay | Admitting: *Deleted

## 2019-04-22 NOTE — Telephone Encounter (Signed)
Pt no showed her doxy VV visit today.

## 2020-02-06 ENCOUNTER — Ambulatory Visit: Payer: Self-pay

## 2020-02-06 NOTE — Telephone Encounter (Signed)
Patient called stating that her period was late and when it started her flow has been severe.  She state that she was flooding tampons every 30 minutes she has moderate low back pain and cramping.  She does have unprotected intercourse.  She has used several pregnancy test which were negative. Other symptom are migraine Headaches have been frequent since these symptoms started.  She has had 2 miscarages Per protocol patient will go to UC or ER for evaluation. Care advice read to patient she verbalized understanding.  Reason for Disposition . [1] SEVERE vaginal bleeding (e.g., soaking 2 pads or tampons per hour and present 2 or more hours) AND [2] worse than ever before  Answer Assessment - Initial Assessment Questions 1. LOCATION: "Where does it hurt?"      Lower back abdominal pain 2. ONSET: "When did this episode of pain begin?"      5th 3. SEVERITY: "How bad is the pain?" "Are you missing school or work because of the pain?"  (e.g., Scale 1-10; mild, moderate, or severe)   - MILD (1-3): doesn't interfere with normal activities, lasting 1-2 days    - MODERATE (4-7): interferes with normal activities (missing work or school), lasting 2-3 days, some associated GI symptoms    - SEVERE (8-10): excruciating pain, lasting 2-7 days, associated GI symptoms, pain radiating into thighs and back     4-7 4. VAGINAL BLEEDING: "Describe the bleeding that you are having." "How much bleeding is there?"    - SPOTTING: spotting, or pinkish / brownish mucous discharge; does not fill panti-liner or pad    - MILD:  less than 1 pad / hour; less than patient's usual menstrual bleeding   - MODERATE: 1-2 pads / hour; small-medium blood clots (e.g., pea, grape, small coin)    - SEVERE: soaking 2 or more pads/hour for 2 or more hours; bleeding not contained by pads or continuous red blood from vagina; large blood clots (e.g., golf ball, large coin)    Changes tampons every 30 minites  Passing large clots 5. MENSTRUAL  HISTORY:  "When did this menstrual period begin?", "Is this a normal period for you?"       Is overdue 6. LMP:  "When did your last menstrual period begin?"     March 6 th 7. OTHER SYMPTOMS: "What other symptoms are you having with the pain?" (e.g., fever, dizzy/lighthead, vomiting, diarrhea, vaginal discharge)    no 8. PREGNANCY: "Is there any chance you are pregnant?" (e.g., unprotected intercourse, missed birth control pill, broken condom)    unprtected intercourse  Protocols used: ABDOMINAL PAIN - MENSTRUAL CRAMPS-A-AH

## 2020-02-11 IMAGING — US US OB < 14 WEEKS - US OB TV
1 series · 15 of 28 positions shown · non-contrast
Comparison: None.

CLINICAL DATA: Pregnant, pain

EXAM:
OBSTETRIC <14 WK US AND TRANSVAGINAL OB US
TECHNIQUE: Both transabdominal and transvaginal ultrasound examinations were
performed for complete evaluation of the gestation as well as the
maternal uterus, adnexal regions, and pelvic cul-de-sac.
Transvaginal technique was performed to assess early pregnancy.

[Series 1: us ob < 14 weeks - us ob tv · 15 of 64 slices shown]
[im 1/64]
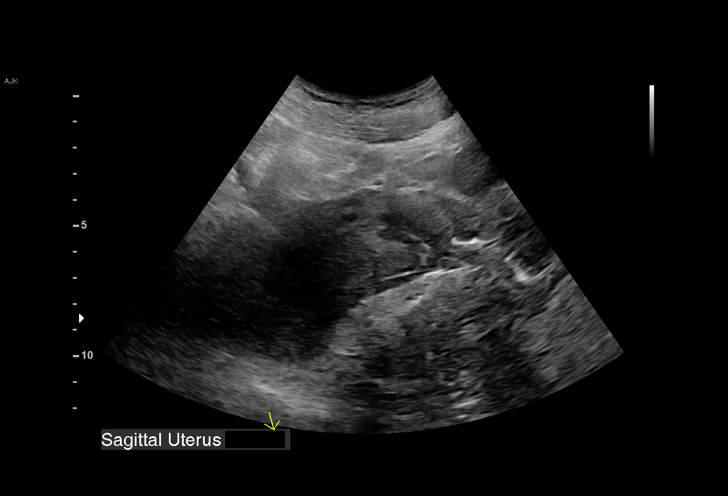
[im 5/64]
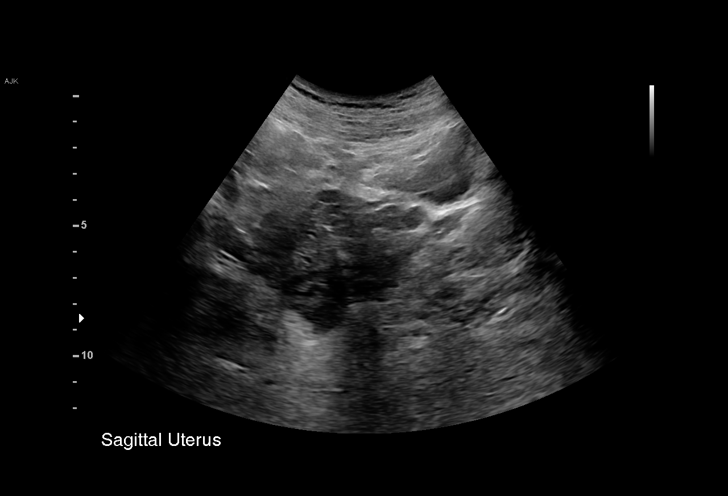
[im 10/64]
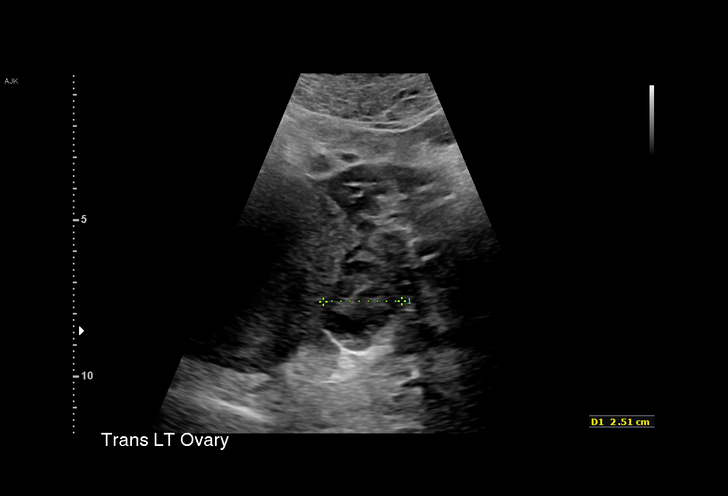
[im 15/64]
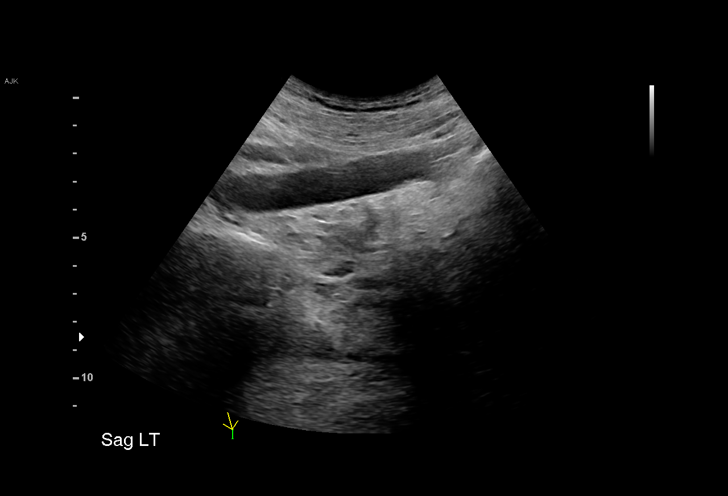
[im 19/64]
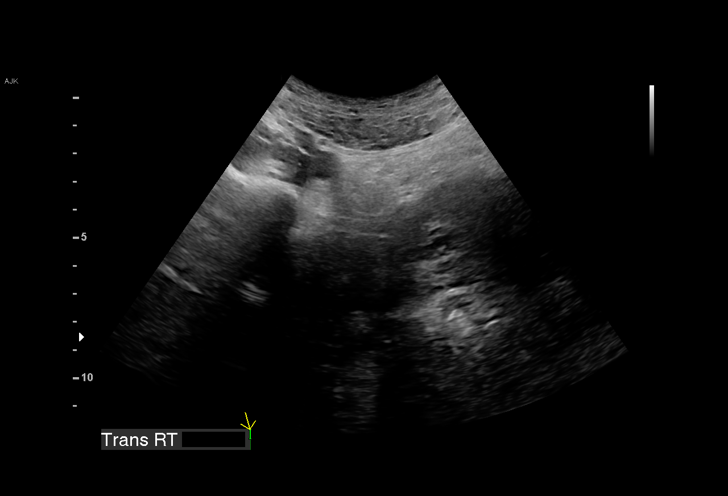
[im 24/64]
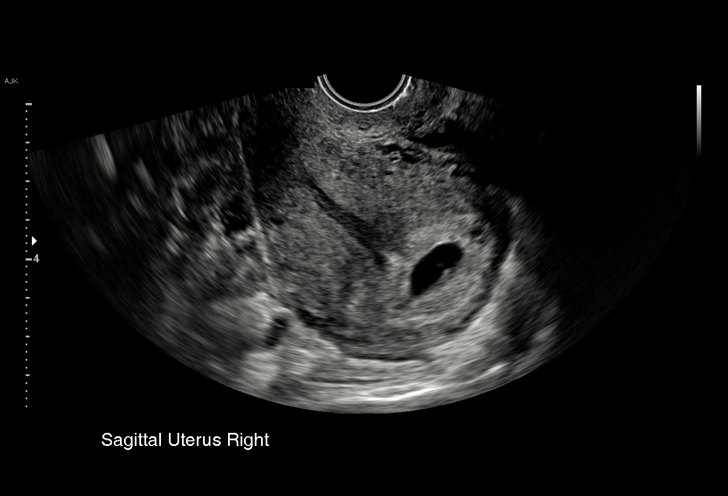
[im 29/64]
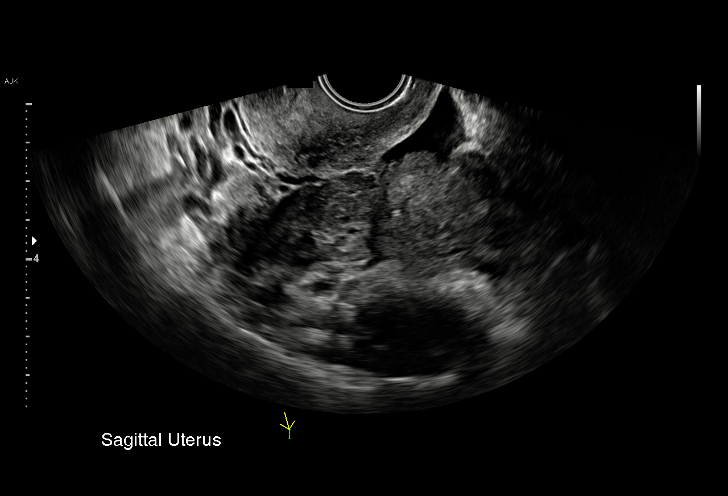
[im 33/64]
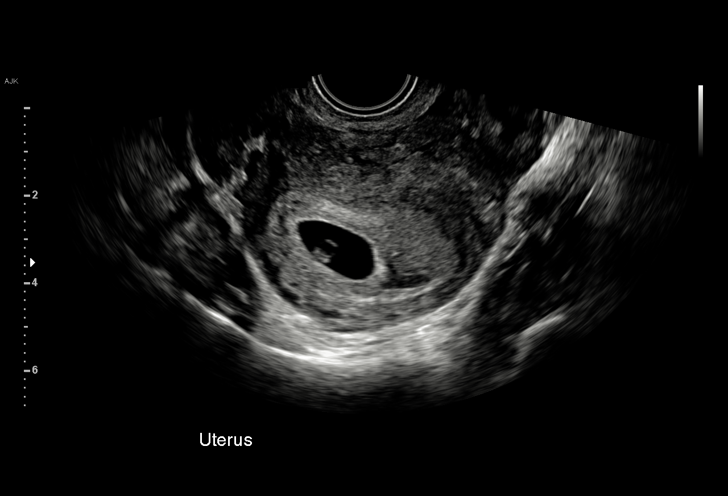
[im 36/64]
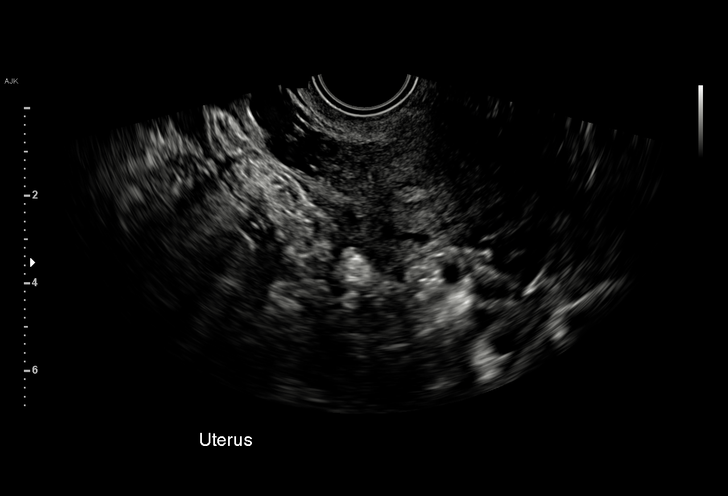
[im 40/64]
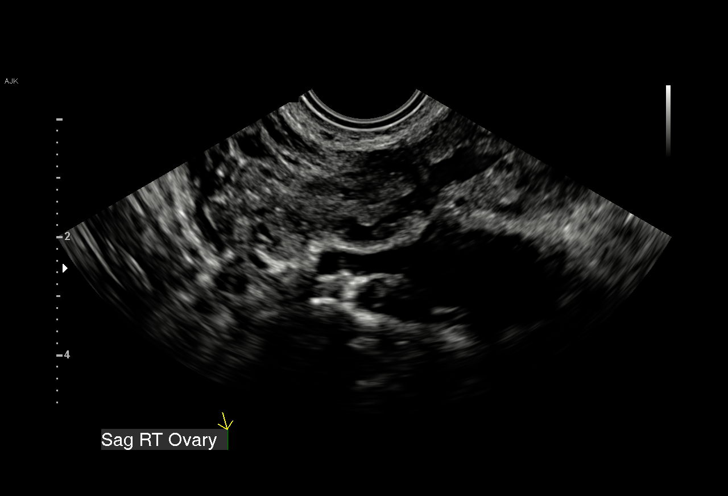
[im 45/64]
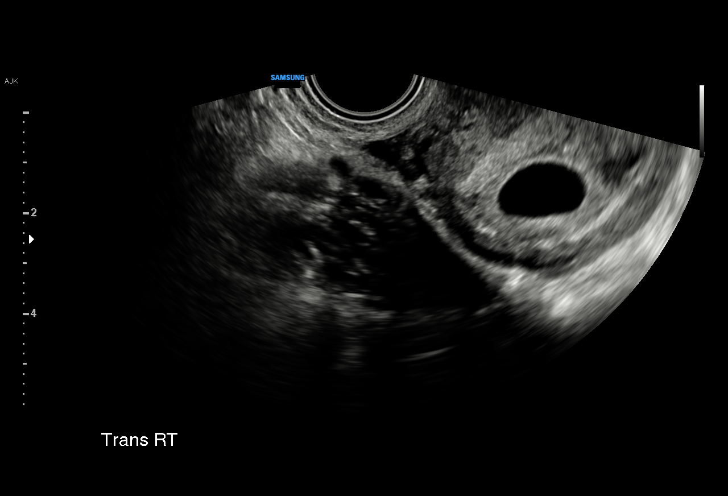
[im 50/64]
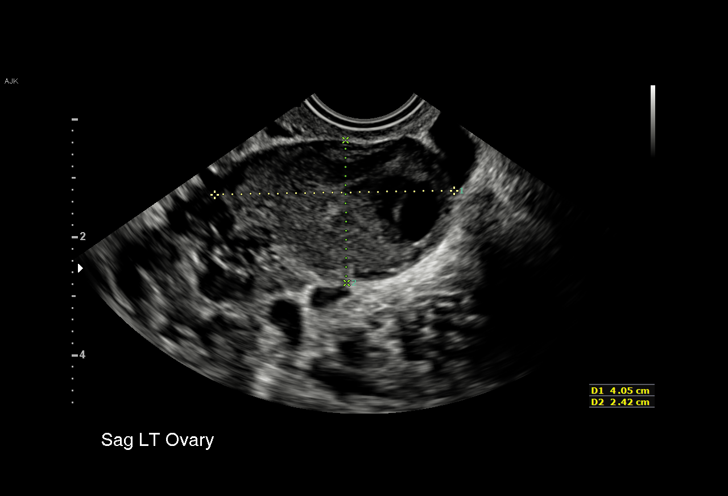
[im 54/64]
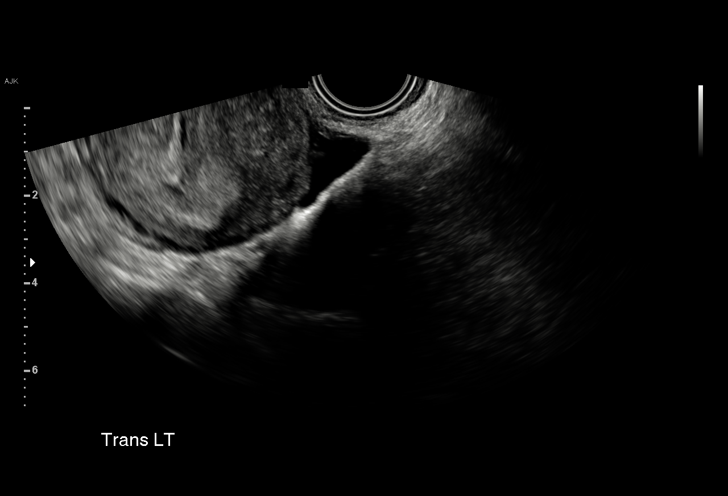
[im 59/64]
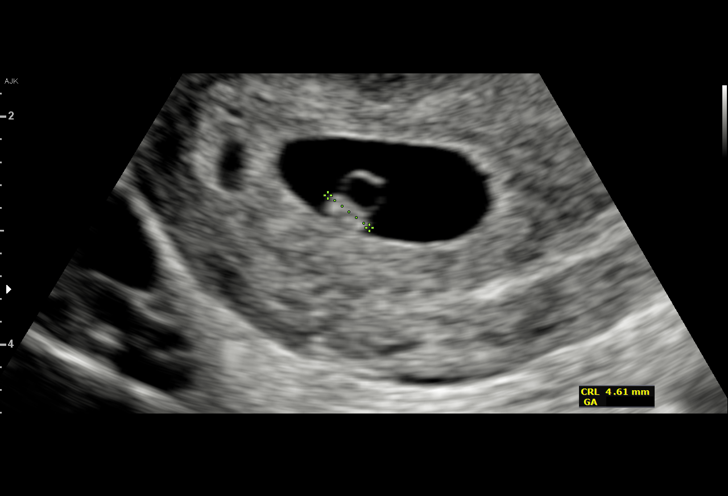
[im 64/64]
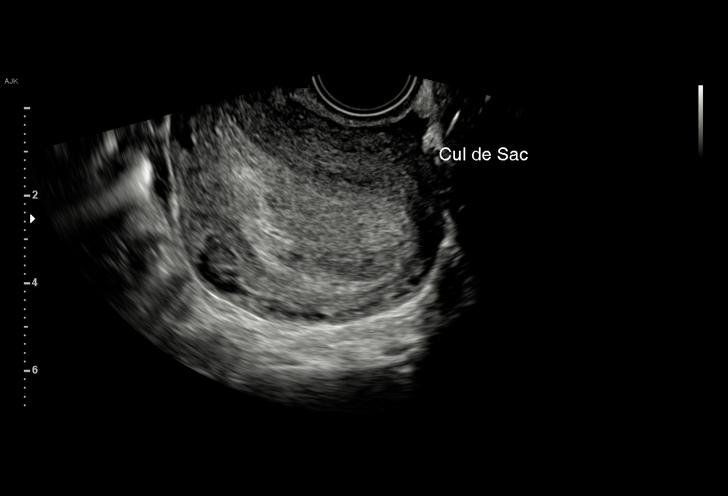

[15 of 28 positions shown; findings below may reference images not displayed]

FINDINGS: Intrauterine gestational sac: Single

Yolk sac:  Visualized.

Embryo:  Visualized.

Cardiac Activity: Visualized.

Heart Rate: 121 bpm

CRL:  4.6 mm   6 w   1 d                  US EDC: 12/03/2018

Subchorionic hemorrhage:  None visualized.

Maternal uterus/adnexae: Bilateral ovaries are within normal limits,
noting a left corpus luteal cyst.

No free fluid.
IMPRESSION: Single live intrauterine gestation, with estimated gestational age 6
weeks 1 day by crown-rump length, as above.

## 2020-12-30 ENCOUNTER — Other Ambulatory Visit: Payer: Self-pay

## 2020-12-30 ENCOUNTER — Ambulatory Visit
Admission: EM | Admit: 2020-12-30 | Discharge: 2020-12-30 | Disposition: A | Discharge status: Home / Self Care | Payer: Medicaid Other

## 2020-12-30 NOTE — ED Notes (Signed)
Not notified about this patient.   Patient checked and left at the same time.

## 2021-02-06 ENCOUNTER — Inpatient Hospital Stay (HOSPITAL_COMMUNITY)
Admission: AD | Admit: 2021-02-06 | Discharge: 2021-02-06 | Discharge status: Against Medical Advice | Payer: Medicaid Other | Attending: Obstetrics and Gynecology | Admitting: Obstetrics and Gynecology

## 2021-02-06 ENCOUNTER — Encounter (HOSPITAL_COMMUNITY): Payer: Self-pay | Admitting: Family Medicine

## 2021-02-06 ENCOUNTER — Other Ambulatory Visit: Payer: Self-pay

## 2021-02-06 DIAGNOSIS — O209 Hemorrhage in early pregnancy, unspecified: Secondary | ICD-10-CM | POA: Insufficient documentation

## 2021-02-06 DIAGNOSIS — Z5321 Procedure and treatment not carried out due to patient leaving prior to being seen by health care provider: Secondary | ICD-10-CM | POA: Insufficient documentation

## 2021-02-06 LAB — POCT PREGNANCY, URINE: Preg Test, Ur: POSITIVE — AB

## 2021-02-06 LAB — CBC
HCT: 36.6 % (ref 36.0–46.0)
Hemoglobin: 12.5 g/dL (ref 12.0–15.0)
MCH: 32.6 pg (ref 26.0–34.0)
MCHC: 34.2 g/dL (ref 30.0–36.0)
MCV: 95.3 fL (ref 80.0–100.0)
Platelets: 148 10*3/uL — ABNORMAL LOW (ref 150–400)
RBC: 3.84 MIL/uL — ABNORMAL LOW (ref 3.87–5.11)
RDW: 12.6 % (ref 11.5–15.5)
WBC: 4.4 10*3/uL (ref 4.0–10.5)
nRBC: 0 % (ref 0.0–0.2)

## 2021-02-06 LAB — HCG, QUANTITATIVE, PREGNANCY: hCG, Beta Chain, Quant, S: 422 m[IU]/mL — ABNORMAL HIGH (ref <5)

## 2021-02-06 LAB — HIV ANTIBODY (ROUTINE TESTING W REFLEX): HIV Screen 4th Generation wRfx: NONREACTIVE

## 2021-02-06 NOTE — MAU Note (Addendum)
Pt reports to mau with c/o lower right side pain that started a few days ago.  Denies recent intercourse.  Pt also reports lower back back pain on right side.  Pt also reports spotting when wiping

## 2021-02-06 NOTE — MAU Provider Note (Signed)
   S Patricia Butler is a 25 y.o. [phone] patient who presents to MAU today with complaint of abd pain and spotting. Labs were drawn and [place] ordered, but pt told RN she needed to leave.    O BP 107/65 (BP Location: Left Arm)   Pulse 74   Temp 98.8 F (37.1 C) (Oral)   Resp 17   LMP 01/05/2021   SpO2 100%  Physical Exam  NA: Pt left AMA  A 1. Vaginal bleeding in pregnancy, first trimester   Left AMA  P Left AMA prior to exam or [place]. Warning signs reviewed by RN.   Katrinka Blazing, [place], CNM 02/06/2021 5:01 PM

## 2021-02-06 NOTE — MAU Note (Signed)
Pt left AMA; leaving against medical advice form signed and in medical records.

## 2021-08-22 ENCOUNTER — Ambulatory Visit
Admission: EM | Admit: 2021-08-22 | Discharge: 2021-08-22 | Disposition: A | Discharge status: Home / Self Care | Payer: Medicaid Other | Attending: Emergency Medicine | Admitting: Emergency Medicine

## 2021-08-22 ENCOUNTER — Other Ambulatory Visit: Payer: Self-pay

## 2021-08-22 DIAGNOSIS — N946 Dysmenorrhea, unspecified: Secondary | ICD-10-CM | POA: Diagnosis not present

## 2021-08-22 LAB — POCT URINE PREGNANCY: Preg Test, Ur: NEGATIVE

## 2021-08-22 NOTE — ED Provider Notes (Signed)
[facility]    CSN: [id number] Arrival date & time: 08/22/21  1234      History   Chief Complaint Chief Complaint  Patient presents with   Vaginal Bleeding   Vaginal Itching    HPI Patricia Butler is a 25 y.o. female.   Patient states she is currently having unprotected sexual intercourse with the father of her 2 children, states she thought she might be late for her period, took a pregnancy test and was negative and a day later began to bleed.  Patient states that she has been passing thick clots of tissue in addition to thick dark clots of blood, patient states she is concerned she may be miscarrying, states that the blood also has an unusual ammonia smell which she is only smelled with a previous miscarriage.  Patient states she has been bleeding heavily for 3 days.  Patient is requesting a urine pregnancy test today.  The history is provided by the patient.   Past Medical History:  Diagnosis Date   Anemia    pt unaware of this   Chlamydia    Migraine     Patient Active Problem List   Diagnosis Date Noted   Chronic migraine without aura without status migrainosus, not intractable 08/12/2018   PROM (premature rupture of membranes) 01/31/2017   Thrombocytopenia (HCC) 01/31/2017    Past Surgical History:  Procedure Laterality Date   WISDOM TOOTH EXTRACTION      OB History     Gravida  4   Para  1   Term  1   Preterm      AB  1   Living  1      SAB  1   IAB      Ectopic      Multiple  0   Live Births  1            Home Medications    Prior to Admission medications   Medication Sig Start Date End Date Taking? Authorizing Provider  Probiotic Product (PROBIOTIC PO) Take 1 capsule by mouth daily. Rephresh Probiotics    [provider]    Family History Family History  Problem Relation Age of Onset   Diabetes Other    Migraines Neg Hx     Social History Social History   Tobacco Use   Smoking status: Former     Types: Cigars    Quit date: 01/2018    Years since quitting: 3.5   Smokeless tobacco: Never  Vaping Use   Vaping Use: Never used  Substance Use Topics   Alcohol use: Never   Drug use: Never     Allergies   Patient has no known allergies.   Review of Systems Review of Systems Pertinent findings noted in history of present illness.    Physical Exam Triage Vital Signs ED Triage Vitals  Enc Vitals Group     BP      Pulse      Resp      Temp      Temp src      SpO2      Weight      Height      Head Circumference      Peak Flow      Pain Score      Pain Loc      Pain Edu?      Excl. in GC?    No data found.  Updated Vital Signs BP  128/76 (BP Location: Right Arm)   Pulse 71   Temp 98.7 F (37.1 C) (Oral)   Resp 18   LMP 08/18/2021   SpO2 98%   Breastfeeding Unknown   Visual Acuity Right Eye Distance:   Left Eye Distance:   Bilateral Distance:    Right Eye Near:   Left Eye Near:    Bilateral Near:     Physical Exam Vitals and nursing note reviewed.  Constitutional:      General: She is not in acute distress.    Appearance: Normal appearance. She is not ill-appearing.  HENT:     Head: Normocephalic and atraumatic.  Eyes:     General: Lids are normal.        Right eye: No discharge.        Left eye: No discharge.     Extraocular Movements: Extraocular movements intact.     Conjunctiva/sclera: Conjunctivae normal.     Right eye: Right conjunctiva is not injected.     Left eye: Left conjunctiva is not injected.  Neck:     Trachea: Trachea and phonation normal.  Cardiovascular:     Rate and Rhythm: Normal rate and regular rhythm.     Pulses: Normal pulses.     Heart sounds: Normal heart sounds. No murmur heard.   No friction rub. No gallop.  Pulmonary:     Effort: Pulmonary effort is normal. No accessory muscle usage, prolonged expiration or respiratory distress.     Breath sounds: Normal breath sounds. No stridor, decreased air movement or  transmitted upper airway sounds. No decreased breath sounds, wheezing, rhonchi or rales.  Chest:     Chest wall: No tenderness.  Abdominal:     General: Abdomen is flat. Bowel sounds are normal. There is no distension.     Palpations: Abdomen is soft.     Tenderness: There is no abdominal tenderness. There is no right CVA tenderness or left CVA tenderness.     Hernia: No hernia is present.  Genitourinary:    Comments: Pt politely declines GU exam  Musculoskeletal:        General: Normal range of motion.     Cervical back: Normal range of motion and neck supple. Normal range of motion.  Lymphadenopathy:     Cervical: No cervical adenopathy.  Skin:    General: Skin is warm and dry.     Findings: No erythema or rash.  Neurological:     General: No focal deficit present.     Mental Status: She is alert and oriented to person, place, and time.  Psychiatric:        Mood and Affect: Mood normal.        Behavior: Behavior normal.     UC Treatments / Results  Labs (all labs ordered are listed, but only abnormal results are displayed) Labs Reviewed  POCT URINE PREGNANCY    EKG   Radiology No results found.  Procedures Procedures (including critical care time)  Medications Ordered in UC Medications - No data to display  Initial Impression / Assessment and Plan / UC Course  I have reviewed the triage vital signs and the nursing notes.  Pertinent labs & imaging results that were available during my care of the patient were reviewed by me and considered in my medical decision making (see chart for details).     Pregnancy test today was negative.  Given patient's unusual and overlapping symptoms, I have advised patient to wait a few days for  things to settle out and then reach out to her gynecologist for further evaluation.  Patient verbalized understanding and agreement of plan as discussed.  All questions were addressed during visit.  Please see discharge instructions below  for further details of plan.  Final Clinical Impressions(s) / UC Diagnoses   Final diagnoses:  Dysmenorrhea     Discharge Instructions      Your pregnancy test today is negative.  I encourage you to reach out to your gynecologist regarding your abnormal vaginal discharge and overlapping symptoms.  Your gynecologist may recommend that you are screened for STDs, I do not feel that while you are having these unusual symptoms it would be appropriate to do it at this time.     ED Prescriptions   None    PDMP not reviewed this encounter.   Theadora Rama Scales, [place] 08/23/21 1831

## 2021-08-22 NOTE — ED Triage Notes (Signed)
Pt states she she was prescribed PO medication for BV recently (she took a pregnancy test that was negative). Pt reports having vaginal bleeding after taking pills and still has the vaginal itching.

## 2021-08-22 NOTE — Discharge Instructions (Addendum)
Your pregnancy test today is negative.  I encourage you to reach out to your gynecologist regarding your abnormal vaginal discharge and overlapping symptoms.  Your gynecologist may recommend that you are screened for STDs, I do not feel that while you are having these unusual symptoms it would be appropriate to do it at this time.

## 2021-11-10 ENCOUNTER — Other Ambulatory Visit: Payer: Self-pay

## 2021-11-10 ENCOUNTER — Ambulatory Visit
Admission: EM | Admit: 2021-11-10 | Discharge: 2021-11-10 | Disposition: A | Discharge status: Home / Self Care | Payer: Medicaid Other | Attending: Emergency Medicine | Admitting: Emergency Medicine

## 2021-11-10 DIAGNOSIS — Z209 Contact with and (suspected) exposure to unspecified communicable disease: Secondary | ICD-10-CM | POA: Diagnosis present

## 2021-11-10 DIAGNOSIS — J029 Acute pharyngitis, unspecified: Secondary | ICD-10-CM | POA: Diagnosis present

## 2021-11-10 LAB — POCT RAPID STREP A (OFFICE): Rapid Strep A Screen: NEGATIVE

## 2021-11-10 MED ORDER — LIDOCAINE VISCOUS HCL 2 % MT SOLN: 15.0000 mL | OROMUCOSAL | 0 refills | Status: DC | PRN

## 2021-11-10 MED ORDER — IBUPROFEN 600 MG PO TABS: 600.0000 mg | ORAL_TABLET | Freq: Three times a day (TID) | ORAL | 0 refills | Status: DC | PRN

## 2021-11-10 MED ORDER — GUAIFENESIN 400 MG PO TABS: ORAL_TABLET | ORAL | 0 refills | Status: DC

## 2021-11-10 MED ORDER — OSELTAMIVIR PHOSPHATE 75 MG PO CAPS: 75.0000 mg | ORAL_CAPSULE | Freq: Two times a day (BID) | ORAL | 0 refills | Status: AC

## 2021-11-10 NOTE — Discharge Instructions (Addendum)
Your symptoms and physical exam findings are concerning for a viral respiratory infection.   You were tested for both COVID and influenza today because here in the urgent care setting, we do not have an available option for an individual influenza test.  I apologize for the redundancy if you have already taken a COVID test at home.  The result of your viral testing will be posted to your MyChart once it is complete, this typically takes 24 to 48 hours.  If there is a positive result, you will be contacted by phone with further recommendations, if any.     As you may already know, we are unable to perform rapid testing for influenza due to a national shortage of rapid influenza tests.   I recommend that you begin taking Tamiflu now for empiric treatment of presumed influenza based on the history provided to me today along with my physical exam findings. Tamiflu is an antiviral medication that decreases the severity, duration and transmissibility of influenza virus by preventing the virus from reproducing itself in your body.     If the influenza result is positive, please continue the full 5-day course of Tamiflu.  If the result is negative, please feel free to discontinue Tamiflu if you prefer but do keep in mind that Tamiflu can also be taken preventatively.  Finishing the full 5-day course will decrease the chances of catching influenza from anyone else and will not cause harm otherwise.    Your strep test today is negative.  Throat culture will be performed per our protocol.  The result of your throat culture will be posted to your MyChart once it is complete, this typically takes 3 to 5 days.  If there is a positive result, you will be contacted by phone and antibiotics will be prescribed for you.   Please see the list below for recommended medications, dosages and frequencies to provide relief of your current symptoms:     Ibuprofen  (Advil, Motrin): This is a good anti-inflammatory medication  which addresses aches, pains and inflammation of the upper airways that causes sinus and nasal congestion as well as in the lower airways which makes your cough feel tight and sometimes burn.  I recommend that you take between 400 to 600 mg every 6-8 hours as needed, I have provided you with a prescription for 600 mg.      Guaifenesin (Robitussin, Mucinex): This is an expectorant.  This helps break up chest congestion and loosen up thick nasal drainage making phlegm and drainage more liquid and therefore easier to remove.  I recommend being 400 mg three times daily as needed.      Lidocaine (Xylocaine): This is a numbing medication that can be switched for 15 seconds and either spit out or swallowed.  You can use this every 3 hours while awake to relieve pain in your mouth and throat.  I have sent a prescription for this medication to your pharmacy.   Please follow-up within the next 3 to 5 days either with your primary care provider or urgent care if your symptoms do not resolve.  If you do not have a primary care provider, we will assist you in finding one.  If you would like to bring your son and daughter in for evaluation, I will be happy to test them for COVID, flu and RSV as well.

## 2021-11-10 NOTE — ED Triage Notes (Signed)
Pt c/o sore throat since yesterday 

## 2021-11-10 NOTE — ED Provider Notes (Signed)
[facility]    CSN: [id number] Arrival date & time: 11/10/21  0805    HISTORY   Chief Complaint  Patient presents with   Sore Throat   HPI Patricia Butler is a 26 y.o. female. Patient reports rapid onset of sore throat.  Patient states she has 2 children at home, ages 35 and 32, who have both been sick.  Patient states that her 76-year-old was seen by her pediatrician, no testing was performed and he was provided with an albuterol inhaler.  Mom states she was not even aware that he had asthma.  Mom states that when she listened with her own ear on his chest, she could hear wheezing.  Mom states that initially he had a very dry cough but it is now "juicier".  Mom states she has been having some body aches, dry cough in addition to her sore throat.  Mom states she feels like she has had a fever but did not check it, temp is 99.4 on arrival today.  The history is provided by the patient.  Past Medical History:  Diagnosis Date   Anemia    pt unaware of this   Chlamydia    Migraine    Patient Active Problem List   Diagnosis Date Noted   Chronic migraine without aura without status migrainosus, not intractable 08/12/2018   PROM (premature rupture of membranes) 01/31/2017   Thrombocytopenia ([place]) 01/31/2017   Past Surgical History:  Procedure Laterality Date   WISDOM TOOTH EXTRACTION     OB History     Gravida  4   Para  1   Term  1   Preterm      AB  1   Living  1      SAB  1   IAB      Ectopic      Multiple  0   Live Births  1          Home Medications    Prior to Admission medications   Medication Sig Start Date End Date Taking? Authorizing Provider   Family History Family History  Problem Relation Age of Onset   Diabetes Other    Migraines Neg Hx    Social History Social History   Tobacco Use   Smoking status: Former    Types: Cigars    Quit date: 01/2018    Years since quitting: 3.7   Smokeless tobacco: Never  Vaping Use    Vaping Use: Never used  Substance Use Topics   Alcohol use: Never   Drug use: Never   Allergies   Patient has no known allergies.  Review of Systems Review of Systems Pertinent findings noted in history of present illness.   Physical Exam Triage Vital Signs ED Triage Vitals  Enc Vitals Group     BP 08/26/21 0827 (!) 147/82     Pulse Rate 08/26/21 0827 72     Resp 08/26/21 0827 18     Temp 08/26/21 0827 98.3 F (36.8 C)     Temp Source 08/26/21 0827 Oral     SpO2 08/26/21 0827 98 %     Weight --      Height --      Head Circumference --      Peak Flow --      Pain Score 08/26/21 0826 5     Pain Loc --      Pain Edu? --      Excl. in [place]? --  No data found.  Updated Vital Signs BP 105/70 (BP Location: Right Arm)    Pulse 67    Temp 99.4 F (37.4 C) (Oral)    Resp 16    LMP 10/17/2021    SpO2 98%   Physical Exam Constitutional:      Appearance: She is ill-appearing.  HENT:     Head: Normocephalic and atraumatic.     Salivary Glands: Right salivary gland is not diffusely enlarged or tender. Left salivary gland is not diffusely enlarged or tender.     Right Ear: Tympanic membrane, ear canal and external ear normal.     Left Ear: Tympanic membrane, ear canal and external ear normal.     Nose: Congestion and rhinorrhea present. Rhinorrhea is clear.     Right Sinus: No maxillary sinus tenderness or frontal sinus tenderness.     Left Sinus: No maxillary sinus tenderness.     Mouth/Throat:     Mouth: Mucous membranes are moist.     Pharynx: Pharyngeal swelling, posterior oropharyngeal erythema and uvula swelling present.     Tonsils: No tonsillar exudate. 0 on the right. 0 on the left.  Cardiovascular:     Rate and Rhythm: Normal rate and regular rhythm.     Pulses: Normal pulses.  Pulmonary:     Effort: Pulmonary effort is normal. No accessory muscle usage, prolonged expiration or respiratory distress.     Breath sounds: No stridor. No wheezing, rhonchi or rales.      Comments: Turbulent breath sounds throughout without wheeze, rale, rhonchi. Abdominal:     General: Abdomen is flat. Bowel sounds are normal.     Palpations: Abdomen is soft.  Musculoskeletal:        General: Normal range of motion.  Lymphadenopathy:     Cervical: Cervical adenopathy present.     Right cervical: Superficial cervical adenopathy and posterior cervical adenopathy present.     Left cervical: Superficial cervical adenopathy and posterior cervical adenopathy present.  Skin:    General: Skin is warm and dry.  Neurological:     General: No focal deficit present.     Mental Status: She is alert and oriented to person, place, and time.     Motor: Motor function is intact.     Coordination: Coordination is intact.     Gait: Gait is intact.     Deep Tendon Reflexes: Reflexes are normal and symmetric.  Psychiatric:        Attention and Perception: Attention and perception normal.        Mood and Affect: Mood and affect normal.        Speech: Speech normal.        Behavior: Behavior normal. Behavior is cooperative.        Thought Content: Thought content normal.    Visual Acuity Right Eye Distance:   Left Eye Distance:   Bilateral Distance:    Right Eye Near:   Left Eye Near:    Bilateral Near:     UC Couse / Diagnostics / Procedures:    EKG  Radiology No results found.  Procedures Procedures (including critical care time)  UC Diagnoses / Final Clinical Impressions(s)   I have reviewed the triage vital signs and the nursing notes.  Pertinent labs & imaging results that were available during my care of the patient were reviewed by me and considered in my medical decision making (see chart for details).   Final diagnoses:  Acute pharyngitis, unspecified etiology  Exposure to  communicable disease   Physical exam is mildly concerning for influenza infection, rapid test not available at this time.  Because of her exposure to both of her children with dry cough and  wheezing, I have advised her that I recommend COVID, flu and RSV testing at this time, particularly since her son was not tested for RSV.  I would like for her to begin taking Tamiflu now, supportive medications prescribed, conservative care recommended.  Return precautions advised.  ED Prescriptions     Medication Sig Dispense Auth. Provider   ibuprofen (ADVIL) 600 MG tablet Take 1 tablet (600 mg total) by mouth every 8 (eight) hours as needed for up to 30 doses for fever, headache, mild pain or moderate pain (Inflammation). Take 1 tablet 3 times daily as needed for inflammation of upper airways and/or pain. 30 tablet Lynden Oxford Scales, PA-C   oseltamivir (TAMIFLU) 75 MG capsule Take 1 capsule (75 mg total) by mouth every 12 (twelve) hours for 5 days. 10 capsule Lynden Oxford Scales, PA-C   lidocaine (XYLOCAINE) 2 % solution Use as directed 15 mLs in the mouth or throat every 3 (three) hours as needed for mouth pain (Sore throat). 300 mL Lynden Oxford Scales, PA-C   guaifenesin (HUMIBID E) 400 MG TABS tablet Take 1 tablet 3 times daily as needed for chest congestion and cough 21 tablet Lynden Oxford Scales, PA-C      PDMP not reviewed this encounter.  Pending results:  Labs Reviewed  COVID-19, FLU A+B AND RSV  CULTURE, GROUP A STREP [facility])  POCT RAPID STREP A (OFFICE)    Medications Ordered in UC: Medications - No data to display  Disposition Upon Discharge:  Condition: stable for discharge home Home: take medications as prescribed; routine discharge instructions as discussed; follow up as advised.  Patient presented with an acute illness with associated systemic symptoms and significant discomfort requiring urgent management. In my opinion, this is a condition that a prudent lay person (someone who possesses an average knowledge of health and medicine) may potentially expect to result in complications if not addressed urgently such as respiratory distress, impairment of  bodily function or dysfunction of bodily organs.   Routine symptom specific, illness specific and/or disease specific instructions were discussed with the patient and/or caregiver at length.   As such, the patient has been evaluated and assessed, work-up was performed and treatment was provided in alignment with urgent care protocols and evidence based medicine.  Patient/parent/caregiver has been advised that the patient may require follow up for further testing and treatment if the symptoms continue in spite of treatment, as clinically indicated and appropriate.  If the patient was tested for COVID-19, Influenza and/or RSV, then the patient/parent/guardian was advised to isolate at home pending the results of his/her diagnostic coronavirus test and potentially longer if theyre positive. I have also advised pt that if his/her COVID-19 test returns positive, it's recommended to self-isolate for at least 10 days after symptoms first appeared AND until fever-free for 24 hours without fever reducer AND other symptoms have improved or resolved. Discussed self-isolation recommendations as well as instructions for household member/close contacts as per the [facility] and [place] [facility], and also gave patient the [place] packet with this information.  Patient/parent/caregiver has been advised to return to the [facility] or PCP in 3-5 days if no better; to PCP or the Emergency Department if new signs and symptoms develop, or if the current signs or symptoms continue to change or worsen for further workup, evaluation  and treatment as clinically indicated and appropriate  The patient will follow up with their current PCP if and as advised. If the patient does not currently have a PCP we will assist them in obtaining one.   The patient may need specialty follow up if the symptoms continue, in spite of conservative treatment and management, for further workup, evaluation, consultation and treatment as clinically indicated and  appropriate.  Patient/parent/caregiver verbalized understanding and agreement of plan as discussed.  All questions were addressed during visit.  Please see discharge instructions below for further details of plan.  Discharge Instructions:   Discharge Instructions      Your symptoms and physical exam findings are concerning for a viral respiratory infection.   You were tested for both COVID and influenza today because here in the urgent care setting, we do not have an available option for an individual influenza test.  I apologize for the redundancy if you have already taken a COVID test at home.  The result of your viral testing will be posted to your MyChart once it is complete, this typically takes 24 to 48 hours.  If there is a positive result, you will be contacted by phone with further recommendations, if any.     As you may already know, we are unable to perform rapid testing for influenza due to a national shortage of rapid influenza tests.   I recommend that you begin taking Tamiflu now for empiric treatment of presumed influenza based on the history provided to me today along with my physical exam findings. Tamiflu is an antiviral medication that decreases the severity, duration and transmissibility of influenza virus by preventing the virus from reproducing itself in your body.     If the influenza result is positive, please continue the full 5-day course of Tamiflu.  If the result is negative, please feel free to discontinue Tamiflu if you prefer but do keep in mind that Tamiflu can also be taken preventatively.  Finishing the full 5-day course will decrease the chances of catching influenza from anyone else and will not cause harm otherwise.    Your strep test today is negative.  Throat culture will be performed per our protocol.  The result of your throat culture will be posted to your MyChart once it is complete, this typically takes 3 to 5 days.  If there is a positive result, you  will be contacted by phone and antibiotics will be prescribed for you.   Please see the list below for recommended medications, dosages and frequencies to provide relief of your current symptoms:     Ibuprofen  (Advil, Motrin): This is a good anti-inflammatory medication which addresses aches, pains and inflammation of the upper airways that causes sinus and nasal congestion as well as in the lower airways which makes your cough feel tight and sometimes burn.  I recommend that you take between 400 to 600 mg every 6-8 hours as needed, I have provided you with a prescription for 600 mg.      Guaifenesin (Robitussin, Mucinex): This is an expectorant.  This helps break up chest congestion and loosen up thick nasal drainage making phlegm and drainage more liquid and therefore easier to remove.  I recommend being 400 mg three times daily as needed.      Lidocaine (Xylocaine): This is a numbing medication that can be switched for 15 seconds and either spit out or swallowed.  You can use this every 3 hours while awake to relieve pain  in your mouth and throat.  I have sent a prescription for this medication to your pharmacy.   Please follow-up within the next 3 to 5 days either with your primary care provider or urgent care if your symptoms do not resolve.  If you do not have a primary care provider, we will assist you in finding one.  If you would like to bring your son and daughter in for evaluation, I will be happy to test them for COVID, flu and RSV as well.       This office note has been dictated using Museum/gallery curator.  Unfortunately, and despite my best efforts, this method of dictation can sometimes lead to occasional typographical or grammatical errors.  I apologize in advance if this occurs.     Lynden Oxford Scales, [place] 11/10/21 [phone]

## 2021-11-11 LAB — COVID-19, FLU A+B AND RSV
Influenza A, NAA: NOT DETECTED
Influenza B, NAA: NOT DETECTED
RSV, NAA: NOT DETECTED
SARS-CoV-2, NAA: NOT DETECTED

## 2021-11-13 LAB — CULTURE, GROUP A STREP (THRC)

## 2022-11-19 ENCOUNTER — Encounter (HOSPITAL_BASED_OUTPATIENT_CLINIC_OR_DEPARTMENT_OTHER): Payer: Self-pay | Admitting: Emergency Medicine

## 2022-11-19 ENCOUNTER — Emergency Department (HOSPITAL_BASED_OUTPATIENT_CLINIC_OR_DEPARTMENT_OTHER)
Admission: EM | Admit: 2022-11-19 | Discharge: 2022-11-19 | Disposition: A | Discharge status: Home / Self Care | Payer: Medicaid Other | Attending: Emergency Medicine | Admitting: Emergency Medicine

## 2022-11-19 ENCOUNTER — Other Ambulatory Visit: Payer: Self-pay

## 2022-11-19 ENCOUNTER — Emergency Department (HOSPITAL_BASED_OUTPATIENT_CLINIC_OR_DEPARTMENT_OTHER): Payer: Medicaid Other

## 2022-11-19 DIAGNOSIS — G43919 Migraine, unspecified, intractable, without status migrainosus: Secondary | ICD-10-CM | POA: Diagnosis not present

## 2022-11-19 MED ORDER — DEXAMETHASONE SODIUM PHOSPHATE 4 MG/ML IJ SOLN
4.0000 mg | Freq: Once | INTRAMUSCULAR | Status: DC
  Filled 2022-11-19: qty 1

## 2022-11-19 MED ORDER — METOCLOPRAMIDE HCL 5 MG/ML IJ SOLN
10.0000 mg | Freq: Once | INTRAMUSCULAR | Status: DC
  Filled 2022-11-19: qty 2

## 2022-11-19 NOTE — ED Provider Notes (Signed)
[facility] Provider Note   CSN: [id number] Arrival date & time: 11/19/22  1441     History  Chief Complaint  Patient presents with   Migraine    Patricia Butler is a 27 y.o. female.  Patient presents the emergency department with a chief complaint of a migraine headache which is been ongoing for approximately 3 days.  Patient reports extensive history of migraine headaches going all the way back to when she was a child.  She states she has previously been treated with sumatriptan and other medications with little success.  Patient states she was previously referred to neurology approximately 4 years ago but that they were unable to do anything due to her being pregnant at that time.  Patient is concerned today because her migraines have become more frequent and more severe than usual.  Past medical history otherwise significant for anemia  HPI     Home Medications Prior to Admission medications   Medication Sig Start Date End Date Taking? Authorizing Provider  guaifenesin (HUMIBID E) 400 MG TABS tablet Take 1 tablet 3 times daily as needed for chest congestion and cough 11/10/21   Lynden Oxford Scales, PA-C  ibuprofen (ADVIL) 600 MG tablet Take 1 tablet (600 mg total) by mouth every 8 (eight) hours as needed for up to 30 doses for fever, headache, mild pain or moderate pain (Inflammation). Take 1 tablet 3 times daily as needed for inflammation of upper airways and/or pain. 11/10/21   Lynden Oxford Scales, PA-C  lidocaine (XYLOCAINE) 2 % solution Use as directed 15 mLs in the mouth or throat every 3 (three) hours as needed for mouth pain (Sore throat). 11/10/21   Lynden Oxford Scales, PA-C      Allergies    Patient has no known allergies.    Review of Systems   Review of Systems  Eyes:  Positive for visual disturbance.  Gastrointestinal:  Positive for nausea.  Neurological:  Positive for headaches.    Physical Exam Updated Vital  Signs BP 128/82   Pulse (!) 53   Temp 98 F (36.7 C)   Resp 20   Ht 5\' 3"  (1.6 m)   Wt 70.3 kg   SpO2 100%   BMI 27.46 kg/m  Physical Exam HENT:     Head: Normocephalic and atraumatic.  Eyes:     Extraocular Movements: Extraocular movements intact.     Conjunctiva/sclera: Conjunctivae normal.     Pupils: Pupils are equal, round, and reactive to light.  Cardiovascular:     Rate and Rhythm: Normal rate.  Pulmonary:     Effort: Pulmonary effort is normal. No respiratory distress.  Musculoskeletal:        General: No signs of injury.     Cervical back: Normal range of motion.  Skin:    General: Skin is dry.  Neurological:     General: No focal deficit present.     Mental Status: She is alert.  Psychiatric:        Speech: Speech normal.        Behavior: Behavior normal.     ED Results / Procedures / Treatments   Labs (all labs ordered are listed, but only abnormal results are displayed) Labs Reviewed - No data to display  EKG None  Radiology CT Head Wo Contrast  Result Date: 11/19/2022 CLINICAL DATA:  Worsening headache. EXAM: CT HEAD WITHOUT CONTRAST TECHNIQUE: Contiguous axial images were obtained from the base of the skull through  the vertex without intravenous contrast. RADIATION DOSE REDUCTION: This exam was performed according to the departmental dose-optimization program which includes automated exposure control, adjustment of the mA and/or kV according to patient size and/or use of iterative reconstruction technique. COMPARISON:  None Available. FINDINGS: Brain: No evidence of acute infarction, hemorrhage, hydrocephalus, extra-axial collection or mass lesion/mass effect. Vascular: No hyperdense vessel or unexpected calcification. Skull: Normal. Negative for fracture or focal lesion. Sinuses/Orbits: No acute finding. Other: None. IMPRESSION: No acute intracranial pathology. Electronically Signed   By: Lovey Newcomer M.D.   On: 11/19/2022 17:42    Procedures Procedures     Medications Ordered in ED Medications  metoCLOPramide (REGLAN) injection 10 mg (has no administration in time range)  dexamethasone (DECADRON) injection 4 mg (has no administration in time range)    ED Course/ Medical Decision Making/ A&P                             Medical Decision Making Amount and/or Complexity of Data Reviewed Radiology: ordered.  Risk Prescription drug management.   Patient presents with a chief complaint of headache.  Differential diagnosis includes was not limited to intracranial abnormality, migraine, other headache disorder, and others  I reviewed the patient's past medical history which included primary care notes showing previous treatments for migraines  I ordered and interpreted imaging including CT head without contrast.  Results were unremarkable with no acute abnormalities.  I agree with the radiologist findings  I ordered the patient Decadron and Reglan but the patient decided she wanted to leave prior to taking the medication due to having her children whether.  The patient continues to have a headache but the CT scan was unremarkable.  This does seem consistent based on history with patient's previous migraines.  Patient has had a longstanding history of migraines going back possibly decades.  There is no indication for admission or further emergent workup at this time.  Ambulatory referral to neurology was placed.  Patient was also given contact information in case she does not hear from neurology.  Patient may resume her home medications when she returns home.        Final Clinical Impression(s) / ED Diagnoses Final diagnoses:  Intractable migraine without status migrainosus, unspecified migraine type    Rx / DC Orders ED Discharge Orders          Ordered    Ambulatory referral to Neurology       Comments: An appointment is requested in approximately: 2 weeks   11/19/22 1911              Ronny Bacon 11/19/22 1917    Davonna Belling, MD 11/19/22 [phone]

## 2022-11-19 NOTE — Discharge Instructions (Signed)
You were evaluated today for headache.  Your CT scan was reassuring for no acute findings.  Please continue to take your home medications as previously prescribed.  Please follow-up with neurology.  I have placed an ambulatory referral.  They should call you within the next few days.  If you do not hear from them within the next 3 days call the number listed above.

## 2022-11-19 NOTE — ED Notes (Signed)
Pt in contrast

## 2022-11-19 NOTE — ED Notes (Signed)
Meds given by previous shift staff. Pt sitting on bed, states she is ready to go and is feeling better. Discharge instructions discussed with pt, pt verbalized understanding, denies having any questions.

## 2022-11-19 NOTE — ED Triage Notes (Signed)
Pt arrived POV, caox4, ambulatory. Pt c/o migraine stating she has had an ongoing migraine for approx 3 days. Pt reports extensive hx of migraines but states she is concerned because they usually don't last this long.

## 2022-12-07 ENCOUNTER — Encounter: Payer: Self-pay | Admitting: Neurology

## 2023-01-18 ENCOUNTER — Ambulatory Visit: Payer: Medicaid Other | Admitting: Neurology

## 2023-01-18 ENCOUNTER — Encounter: Payer: Self-pay | Admitting: Neurology

## 2023-01-18 VITALS — BP 109/79 | HR 63 | Ht 63.0 in | Wt 154.4 lb

## 2023-01-18 DIAGNOSIS — G43709 Chronic migraine without aura, not intractable, without status migrainosus: Secondary | ICD-10-CM

## 2023-01-18 MED ORDER — ONDANSETRON HCL 4 MG PO TABS: 4.0000 mg | ORAL_TABLET | Freq: Three times a day (TID) | ORAL | 5 refills | Status: AC | PRN

## 2023-01-18 MED ORDER — SUMATRIPTAN SUCCINATE 100 MG PO TABS: ORAL_TABLET | ORAL | 5 refills | Status: AC

## 2023-01-18 MED ORDER — NORTRIPTYLINE HCL 10 MG PO CAPS: 10.0000 mg | ORAL_CAPSULE | Freq: Every day | ORAL | 5 refills | Status: AC

## 2023-01-18 NOTE — Patient Instructions (Signed)
  Start nortriptyline 10mg  at bedtime.  Contact us in 4 weeks with update and we can increase dose if needed. Take sumatriptan at earliest onset of headache.  May repeat dose once in 2 hours if needed.  Maximum 2 tablets in 24 hours.  Take ondansetron for nausea.  STOP BC POWDER Limit use of pain relievers to no more than 2 days out of the week.  These medications include acetaminophen, NSAIDs (ibuprofen/Advil/Motrin, naproxen/Aleve, triptans (Imitrex/sumatriptan), Excedrin, and narcotics.  This will help reduce risk of rebound headaches.  Routine exercise Stay adequately hydrated (aim for 64 oz water daily) Keep headache diary Maintain proper stress management Maintain proper sleep hygiene Do not skip meals Consider supplements:  magnesium citrate 400mg  daily, riboflavin 400mg  daily, coenzyme Q10 100mg  three times daily. 11.  Follow up 4 to 5 months.

## 2023-01-18 NOTE — Progress Notes (Signed)
NEUROLOGY CONSULTATION NOTE  Patricia Butler MRN: [id number] DOB: 09/20/1996  Referring provider: Cherlynn June, PA-C   Reason for consult:  migraines  Assessment/Plan:   Chronic migraine without aura, without status migrainosus, intractable  Migraine prevention:  start nortriptyline 10mg  at bedtime.  We can increase to 25mg  at bedtime in 4 weeks if needed. Migraine rescue:  sumatriptan 100mg  and Zofran 4mg  Limit use of pain relievers to no more than 2 days out of week to prevent risk of rebound or medication-overuse headache. Keep headache diary Follow up 4-5 months.    Subjective:  Patricia Butler is a 27 year old female who presents for migraines.  History supplemented by ED and prior neurologist's notes.  CT head personally reviewed.  Longstanding history of chronic migraines. Onset:  childhood Location:  varies (temples, crown, frontal, occipital/back of neck, holocephalic) Quality:  pounding, throbbing Intensity:  severe.   Aura:  absent Prodrome:  absent Associated symptoms:  Blurred vision, dizziness, nausea, vomiting, photophobia, phonophobia, slurred speech.  She denies associated unilateral numbness or weakness. Duration:  4 hours with nap, usually all day Frequency:  7 days a month (within a week such as around her period or spread out) Frequency of abortive medication: no more than 7 days a month. Triggers:  stress, sleep deprivation, menstrual cycle, birth control medication Relieving factors:  sleep, pregnancy Activity:  aggravates  Seen in the ED on 11/19/2022 for intractable migraine where CT head was performed was normal.    Past NSAIDS/analgesics:  ibuprofen, acetaminophen Past abortive triptans:  rizatriptan Past abortive ergotamine:  none Past muscle relaxants:  none Past anti-emetic:  Zofran Past antihypertensive medications:  none Past antidepressant medications:  none Past anticonvulsant medications:  none Past anti-CGRP:  none Past  vitamins/Herbal/Supplements:  none Past antihistamines/decongestants:  none Other past therapies:  none  Rescue protocol:  BC powder, water, ginger ale, nap Current NSAIDS/analgesics:  BC powder Current triptans:  none Current ergotamine:  none Current anti-emetic:  none Current muscle relaxants:  none Current Antihypertensive medications:  none Current Antidepressant medications:  none Current Anticonvulsant medications:  none Current anti-CGRP:  none Current Vitamins/Herbal/Supplements:  none Current Antihistamines/Decongestants:  none Other therapy:  hydration, rest Birth control:  no   Caffeine:  None Diet:  Hydrates with water.  Ginger ale for migraines.  Sometimes skips breakfast Exercise:  no Depression:  no; Anxiety:  nothing significant Sleep hygiene:  good but sometimes inconsistent schedule Family history of headache:  none      PAST MEDICAL HISTORY: Past Medical History:  Diagnosis Date   Anemia    pt unaware of this   Chlamydia    Migraine     PAST SURGICAL HISTORY: Past Surgical History:  Procedure Laterality Date   WISDOM TOOTH EXTRACTION      MEDICATIONS: Current Outpatient Medications on File Prior to Visit  Medication Sig Dispense Refill   guaifenesin (HUMIBID E) 400 MG TABS tablet Take 1 tablet 3 times daily as needed for chest congestion and cough 21 tablet 0   ibuprofen (ADVIL) 600 MG tablet Take 1 tablet (600 mg total) by mouth every 8 (eight) hours as needed for up to 30 doses for fever, headache, mild pain or moderate pain (Inflammation). Take 1 tablet 3 times daily as needed for inflammation of upper airways and/or pain. 30 tablet 0   lidocaine (XYLOCAINE) 2 % solution Use as directed 15 mLs in the mouth or throat every 3 (three) hours as needed for mouth pain (Sore throat).  300 mL 0   No current facility-administered medications on file prior to visit.    ALLERGIES: No Known Allergies  FAMILY HISTORY: Family History  Problem  Relation Age of Onset   Diabetes Other    Migraines Neg Hx     Objective:  Blood pressure 109/79, pulse 63, height 5\' 3"  (1.6 m), weight 154 lb 6.4 oz (70 kg), SpO2 98 %, unknown if currently breastfeeding. General: No acute distress.  Patient appears well-groomed.   Head:  Normocephalic/atraumatic Eyes:  fundi examined but not visualized Neck: supple, no paraspinal tenderness, full range of motion Back: No paraspinal tenderness Heart: regular rate and rhythm Lungs: Clear to auscultation bilaterally. Vascular: No carotid bruits. Neurological Exam: Mental status: alert and oriented to person, place, and time, speech fluent and not dysarthric, language intact. Cranial nerves: CN I: not tested CN II: pupils equal, round and reactive to light, visual fields intact CN III, IV, VI:  full range of motion, no nystagmus, no ptosis CN V: facial sensation intact. CN VII: upper and lower face symmetric CN VIII: hearing intact CN IX, X: gag intact, uvula midline CN XI: sternocleidomastoid and trapezius muscles intact CN XII: tongue midline Bulk & Tone: normal, no fasciculations. Motor:  muscle strength 5/5 throughout Sensation:  Pinprick, temperature and vibratory sensation intact. Deep Tendon Reflexes:  2+ throughout,  toes downgoing.   Finger to nose testing:  Without dysmetria.   Heel to shin:  Without dysmetria.   Gait:  Normal station and stride.  Romberg negative.    Thank you for allowing me to take part in the care of this patient.  Metta Clines, DO

## 2023-03-12 ENCOUNTER — Other Ambulatory Visit: Payer: Self-pay | Admitting: Nurse Practitioner

## 2023-03-12 DIAGNOSIS — N6310 Unspecified lump in the right breast, unspecified quadrant: Secondary | ICD-10-CM

## 2023-03-27 ENCOUNTER — Ambulatory Visit: Payer: Medicaid Other | Admitting: Neurology

## 2023-03-28 ENCOUNTER — Other Ambulatory Visit: Payer: Medicaid Other

## 2023-04-04 ENCOUNTER — Other Ambulatory Visit: Payer: Medicaid Other

## 2023-07-03 NOTE — Progress Notes (Deleted)
NEUROLOGY FOLLOW UP OFFICE NOTE  Patricia Butler [id number]  Assessment/Plan:   Chronic migraine without aura, without status migrainosus, intractable   Migraine prevention:  start nortriptyline 10mg  at bedtime.  We can increase to 25mg  at bedtime in 4 weeks if needed. Migraine rescue:  sumatriptan 100mg  and Zofran 4mg  Limit use of pain relievers to no more than 2 days out of week to prevent risk of rebound or medication-overuse headache. Keep headache diary Follow up 4-5 months.  Subjective:  Patricia Butler is a 27 year old female who follows up for migraines.  UPDATE: Started nortriptyline and sumatriptan.  Intensity:  *** Duration:  *** Frequency:  *** Frequency of abortive medication: *** Rescue protocol:  BC powder, water, ginger ale, nap Current NSAIDS/analgesics:  BC powder Current triptans:  sumatriptan 100mg  *** Current ergotamine:  none Current anti-emetic:  Zofran 4mg  Current muscle relaxants:  none Current Antihypertensive medications:  none Current Antidepressant medications:  nortriptyline 10mg  at bedtime *** Current Anticonvulsant medications:  none Current anti-CGRP:  none Current Vitamins/Herbal/Supplements:  none Current Antihistamines/Decongestants:  none Other therapy:  hydration, rest Birth control:  no     Caffeine:  None Diet:  Hydrates with water.  Ginger ale for migraines.  Sometimes skips breakfast Exercise:  no Depression:  no; Anxiety:  nothing significant Sleep hygiene:  good but sometimes inconsistent schedule  HISTORY:  Longstanding history of chronic migraines. Onset:  childhood Location:  varies (temples, crown, frontal, occipital/back of neck, holocephalic) Quality:  pounding, throbbing Intensity:  severe.   Aura:  absent Prodrome:  absent Associated symptoms:  Blurred vision, dizziness, nausea, vomiting, photophobia, phonophobia, slurred speech.  She denies associated unilateral numbness or weakness. Duration:  4 hours  with nap, usually all day Frequency:  7 days a month (within a week such as around her period or spread out) Frequency of abortive medication: no more than 7 days a month. Triggers:  stress, sleep deprivation, menstrual cycle, birth control medication Relieving factors:  sleep, pregnancy Activity:  aggravates   Seen in the ED on 11/19/2022 for intractable migraine where CT head was performed was normal.     Past NSAIDS/analgesics:  ibuprofen, acetaminophen Past abortive triptans:  rizatriptan Past abortive ergotamine:  none Past muscle relaxants:  none Past anti-emetic:  Zofran Past antihypertensive medications:  none Past antidepressant medications:  none Past anticonvulsant medications:  none Past anti-CGRP:  none Past vitamins/Herbal/Supplements:  none Past antihistamines/decongestants:  none Other past therapies:  none    Family history of headache:  none  PAST MEDICAL HISTORY: Past Medical History:  Diagnosis Date   Anemia    pt unaware of this   Chlamydia    Migraine     MEDICATIONS: Current Outpatient Medications on File Prior to Visit  Medication Sig Dispense Refill   nortriptyline (PAMELOR) 10 MG capsule Take 1 capsule (10 mg total) by mouth at bedtime. 30 capsule 5   ondansetron (ZOFRAN) 4 MG tablet Take 1 tablet (4 mg total) by mouth every 8 (eight) hours as needed for nausea or vomiting. 20 tablet 5   SUMAtriptan (IMITREX) 100 MG tablet Take 1 tablet earliest onset of migraine.  May repeat in 2 hours if headache persists or recurs.  Maximum 2 tablets in 24 hours. 10 tablet 5   No current facility-administered medications on file prior to visit.    ALLERGIES: No Known Allergies  FAMILY HISTORY: Family History  Problem Relation Age of Onset   Diabetes Other    Migraines Neg Hx  Objective:  *** General: No acute distress.  Patient appears ***-groomed.   Head:  Normocephalic/atraumatic Eyes:  Fundi examined but not visualized Neck: supple, no  paraspinal tenderness, full range of motion Heart:  Regular rate and rhythm Lungs:  Clear to auscultation bilaterally Back: No paraspinal tenderness Neurological Exam: alert and oriented.  Speech fluent and not dysarthric, language intact.  CN II-XII intact. Bulk and tone normal, muscle strength 5/5 throughout.  Sensation to light touch intact.  Deep tendon reflexes 2+ throughout, toes downgoing.  Finger to nose testing intact.  Gait normal, Romberg negative.   Shon Millet, DO  CC: ***

## 2023-07-04 ENCOUNTER — Encounter: Payer: Self-pay | Admitting: Neurology

## 2023-07-04 ENCOUNTER — Ambulatory Visit: Payer: Medicaid Other | Admitting: Neurology
